# Patient Record
Sex: Female | Born: 1960 | Race: White | Hispanic: No | Marital: Married | Smoking: Never smoker
Health system: Southern US, Community
[De-identification: ages and names within clinical notes are randomized; demographics above are authoritative.]

## PROBLEM LIST (undated history)

## (undated) DIAGNOSIS — F419 Anxiety disorder, unspecified: Secondary | ICD-10-CM

## (undated) DIAGNOSIS — M1990 Unspecified osteoarthritis, unspecified site: Secondary | ICD-10-CM

## (undated) DIAGNOSIS — T7840XA Allergy, unspecified, initial encounter: Secondary | ICD-10-CM

## (undated) DIAGNOSIS — G709 Myoneural disorder, unspecified: Secondary | ICD-10-CM

## (undated) DIAGNOSIS — Z789 Other specified health status: Secondary | ICD-10-CM

## (undated) HISTORY — DX: Allergy, unspecified, initial encounter: T78.40XA

## (undated) HISTORY — PX: DILATION AND CURETTAGE OF UTERUS: SHX78

## (undated) HISTORY — PX: TONSILLECTOMY: SUR1361

## (undated) HISTORY — DX: Anxiety disorder, unspecified: F41.9

## (undated) HISTORY — PX: JOINT REPLACEMENT: SHX530

---

## 2002-04-20 ENCOUNTER — Other Ambulatory Visit: Admission: RE | Admit: 2002-04-20 | Discharge: 2002-04-20 | Payer: Self-pay | Admitting: Obstetrics and Gynecology

## 2003-05-18 ENCOUNTER — Other Ambulatory Visit: Admission: RE | Admit: 2003-05-18 | Discharge: 2003-05-18 | Payer: Self-pay | Admitting: Obstetrics and Gynecology

## 2004-06-14 ENCOUNTER — Other Ambulatory Visit: Admission: RE | Admit: 2004-06-14 | Discharge: 2004-06-14 | Payer: Self-pay | Admitting: Obstetrics and Gynecology

## 2005-07-10 ENCOUNTER — Other Ambulatory Visit: Admission: RE | Admit: 2005-07-10 | Discharge: 2005-07-10 | Payer: Self-pay | Admitting: Obstetrics and Gynecology

## 2009-01-25 ENCOUNTER — Encounter: Admission: RE | Admit: 2009-01-25 | Discharge: 2009-01-25 | Payer: Self-pay | Admitting: Sports Medicine

## 2009-01-25 ENCOUNTER — Ambulatory Visit: Payer: Self-pay

## 2009-01-25 DIAGNOSIS — M5412 Radiculopathy, cervical region: Secondary | ICD-10-CM | POA: Insufficient documentation

## 2009-02-08 ENCOUNTER — Ambulatory Visit: Payer: Self-pay | Admitting: Sports Medicine

## 2009-02-08 DIAGNOSIS — M7710 Lateral epicondylitis, unspecified elbow: Secondary | ICD-10-CM | POA: Insufficient documentation

## 2009-02-10 ENCOUNTER — Encounter: Admission: RE | Admit: 2009-02-10 | Discharge: 2009-02-10 | Payer: Self-pay | Admitting: Sports Medicine

## 2009-02-12 ENCOUNTER — Encounter: Payer: Self-pay | Admitting: Sports Medicine

## 2009-02-13 ENCOUNTER — Ambulatory Visit: Payer: Self-pay | Admitting: Sports Medicine

## 2009-02-13 ENCOUNTER — Telehealth (INDEPENDENT_AMBULATORY_CARE_PROVIDER_SITE_OTHER): Payer: Self-pay | Admitting: *Deleted

## 2009-03-01 ENCOUNTER — Telehealth: Payer: Self-pay | Admitting: Sports Medicine

## 2009-03-02 ENCOUNTER — Ambulatory Visit: Payer: Self-pay | Admitting: Sports Medicine

## 2009-03-02 DIAGNOSIS — L255 Unspecified contact dermatitis due to plants, except food: Secondary | ICD-10-CM | POA: Insufficient documentation

## 2009-03-13 ENCOUNTER — Ambulatory Visit: Payer: Self-pay | Admitting: Sports Medicine

## 2009-03-30 ENCOUNTER — Emergency Department (HOSPITAL_COMMUNITY): Admission: EM | Admit: 2009-03-30 | Discharge: 2009-03-30 | Payer: Self-pay | Admitting: Emergency Medicine

## 2009-04-16 ENCOUNTER — Ambulatory Visit: Payer: Self-pay | Admitting: Sports Medicine

## 2010-09-09 ENCOUNTER — Ambulatory Visit: Payer: Self-pay | Admitting: Family Medicine

## 2010-10-08 ENCOUNTER — Ambulatory Visit
Admission: RE | Admit: 2010-10-08 | Discharge: 2010-10-08 | Payer: Self-pay | Source: Home / Self Care | Attending: Sports Medicine | Admitting: Sports Medicine

## 2010-10-08 DIAGNOSIS — M25539 Pain in unspecified wrist: Secondary | ICD-10-CM | POA: Insufficient documentation

## 2010-10-08 DIAGNOSIS — G5600 Carpal tunnel syndrome, unspecified upper limb: Secondary | ICD-10-CM | POA: Insufficient documentation

## 2010-10-29 NOTE — Assessment & Plan Note (Signed)
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 this causes a broadbased disk bulge that causes some change of signal in spinal chord C6/7 also shows some disk bulging some change of signal but less so these is edema in vertebral body of C6 and more at C7  MRI shows some cervical spinal stenosis DDD mostly 2 levels some marrow edema in lower Cx spine   Impression & Recommendations:  Problem # 1:  CERVICAL RADICULOPATHY (ICD-723.4)  begin cervical collar as directed stop tennis until cleared see isntructions  reck 4 wks  Orders: Cervical Foam Collar Non Adjustable (L0120)  Problem # 2:  LATERAL EPICONDYLITIS, LEFT (ICD-726.32) see instructions  Complete Medication List: 1)  Gabapentin 600 Mg Tabs (Gabapentin) .... Take 1 tab three times a day 2)  Tramadol Hcl 50 Mg Tabs (Tramadol hcl) .Marland Kitchen.. 1 by mouth q 6 hrs as needed pain 3)  Diclofenac Sodium 75 Mg Tbec (Diclofenac sodium) .Marland Kitchen.. 1 by mouth two times a day with food  Patient Instructions: 1)  Elbow 2)  begin wrist curls 3)  wrist rolls 4)  do 3 x 30 with only 1 lb 5)  Tennis - hold off until we clear you 6)  neck collar at least 1 hour 3 to 4 times per day 7)  neck motion exercises - easy for 3 moinutes or so 3 to 4 times per day 8)  posture - don't let your head drift forward 9)  cheek bone over breast bone 10)  return in 4 wks

## 2010-10-29 NOTE — Assessment & Plan Note (Signed)
Summary: FU ELBOW PAIN/JW   Vital Signs:  Patient profile:   50 year old female BP sitting:   138 / 80  History of Present Illness: 50yo female to office for f/u L elbow pain.  Pt L-handed tennis player, dx with C7 radiculopathy last visit.  Started on Tramadol & Neurontin, but don't seem to help.  Tramadol makes her "fuzzy" & notices no change with Neurontin (currently on 300mg  q AM & 600mg  q PM & hs.  Pain worse of past 2-weeks & radiating down shoulder to hands.  Feeling more weak in arms & hands.  Difficulty holding utensils.  Played tennis yesterday & really exacerbated pain.  Not using wrist strap.  Some numbness/tingling in hand - mainly 3rd/4th fingers.  No prior injections.  No injury/trauma since last visit.  X-rays following last visit show significant DJD in area of C5-C6.  Physical Exam  General:  Well-developed,well-nourished,in no acute distress; alert,appropriate and cooperative throughout examination Msk:  Neck:  Full ROM today without reproduction of symptoms. Spurling's on L with shooting pain into L trap. R spurling's test negative.  Msk:  no muscle atrophy in either upper extremity. She has weakness on L with triceps testing, wrist extension, finger flexors, pinch between thumb & 1st finger.  Normal biceps strength.  Normal wrist flexion strength. Normal interossei strength.    L elbow with full and painless ROM.  TTP over lateral epicondyle.  Increased pain in lateral epicondyle with resisted wrist extension. Negative tinel's at cubital tunnel and at flexor retinatulum of wrist.     Skin:  no lesions or rashes Pulses:  Pulses:  2+ radial and ulnar pulses, bilaterally symmetric Neurologic:  Neurologic:  +2/4 brachioradialis, biceps, triceps b/l  intact sensation throughout Additional Exam:  MSK u/s: L elbow with small amount of fluid noted around extensor origin & small amount of calcific change.  Extensor muscles all intact. Note findings are consistent on long  view but much more specific on trans view for lateral epicondylitis However, there is no evidence of tear or of major calckfic change images saved   Impression & Recommendations:  Problem # 1:  CERVICAL RADICULOPATHY (ICD-723.4) - Will order MRI to further evaluate since symptoms worsening & more pronounced weakness.  Rx for Valium 5mg  three times a day as needed - should take 2 tabs prior to MRI to help with anxiety/clostriphobia. - Rx for Diclofenac 75mg  two times a day with food - Increase Tramadol to 50mg  2 tabs qid for pain - Increase Neurontin to goal of 600mg  three times a day - May consider soft neck collar to unload pressure on neck, but will await MRI results - No tennis at this time  Orders: MRI without Contrast (MRI w/o Contrast)  Problem # 2:  LATERAL EPICONDYLITIS, LEFT (ICD-726.32)  - Likely exacerbated/related to cervical radiculopathy - u/s of elbow performed showing minor fluid collection at extensor origin - May attempt some light arm exercises with 1-3lbs of resistance, but should decrease activity if experiencing pain - No tennis at this time  Orders: US EXTREMITY NON-VASC REAL-TIME IMG ([address])  Complete Medication List: 1)  Gabapentin 600 Mg Tabs (Gabapentin) .... Take 1 tab three times a day 2)  Tramadol Hcl 50 Mg Tabs (Tramadol hcl) .Marland Kitchen.. 1 by mouth q 6 hrs as needed pain 3)  Diclofenac Sodium 75 Mg Tbec (Diclofenac sodium) .Marland Kitchen.. 1 by mouth two times a day with food  Patient Instructions: 1)  MRI APPT IS SAT, MAY 15TH AT 7:15  AM AT THE [facility]. PT INFORMED 2)  Start Diclofenac two times a day with food 3)  Increase neurontin to 600mg  three times a day 4)  No tennis until further follow-up 5)  Follow-up in 2-3 weeks Prescriptions: DICLOFENAC SODIUM 75 MG TBEC (DICLOFENAC SODIUM) 1 by mouth two times a day with food  #60 x 3   Entered and Authorized by:   Enid Baas MD   Signed by:   Enid Baas MD on 02/08/2009   Method used:    Electronically to        [facility]       [address]       [place], [place]  [address]       Ph: [phone]       Fax: [phone]   RxID:   [id number]

## 2010-10-29 NOTE — Progress Notes (Signed)
Summary: PREAUTH-AIM  PREAUTH-AIM   Imported ByLevada Schilling 02/08/2009 16:23:47  _____________________________________________________________________  External Attachment:    Type:   Image     Comment:   External Document

## 2010-10-29 NOTE — Progress Notes (Signed)
Summary: needs return call  Phone Note Call from Patient Call back at [phone]   Caller: Patient Reason for Call: Talk to Nurse, Talk to Doctor Summary of Call: Requesting return call.  Has a bad case of poison ivy and needs to get a shot.  Wants to know if shot will interfere with any current meds. Initial call taken by: Levada Schilling,  March 01, 2009 4:03 PM    She will come by in morning for a cortisone injection for poison ivy.

## 2010-10-29 NOTE — Assessment & Plan Note (Signed)
Summary: cortisone shot for poison ivy/jw    Complete Medication List: 1)  Gabapentin 600 Mg Tabs (Gabapentin) .... Take 1 tab three times a day 2)  Tramadol Hcl 50 Mg Tabs (Tramadol hcl) .Marland Kitchen.. 1 by mouth q 6 hrs as needed pain 3)  Diclofenac Sodium 75 Mg Tbec (Diclofenac sodium) .Marland Kitchen.. 1 by mouth two times a day with food 4)  Triamcinolone Acetonide 0.1 % Oint (Triamcinolone acetonide) .... Use three times a day as needed poison ivy  Other Orders: Kenalog 10 mg inj (Y7829) Prescriptions: TRIAMCINOLONE ACETONIDE 0.1 % OINT (TRIAMCINOLONE ACETONIDE) use three times a day as needed poison ivy  #60 x 5   Entered by:   Lillia Pauls CMA   Authorized by:   Enid Baas MD   Signed by:   Lillia Pauls CMA on 03/02/2009   Method used:   Electronically to        [facility]       [address]       [place], [place]  [address]       Ph: [phone]       Fax: [phone]   RxID:   [id number]    Medication Administration  Injection # 1:    Medication: Kenalog 10 mg inj    Diagnosis: CONTACT DERMATITIS DUE TO POISON IVY (ICD-692.6)    Route: IM    Site: R deltoid    Exp Date: 07/30/2010    Lot #: [id number]    Mfr: [employer]    Comments: 40 mg given    Patient tolerated injection without complications    Given by: Lillia Pauls CMA (March 02, 2009 10:15 AM)  Orders Added: 1)  Kenalog 10 mg inj [J3301] 2)  Est. Patient Level I [38756]

## 2010-10-29 NOTE — Progress Notes (Signed)
Summary: returning call  Phone Note Call from Patient Call back at [phone] OR [phone]   Caller: Patient Reason for Call: Talk to Nurse Summary of Call: Returning call to get MRI results. Initial call taken by: Levada Schilling,  Feb 13, 2009 8:44 AM  Follow-up for Phone Call        pt coming in this pm to discuss MRI results Follow-up by: Lillia Pauls CMA,  Feb 13, 2009 11:23 AM

## 2010-10-29 NOTE — Assessment & Plan Note (Signed)
Summary: FU NECK PROBLEMS   Vital Signs:  Patient profile:   50 year old female Pulse rate:   72 / minute BP sitting:   129 / 81  (right arm)  Vitals Entered By: Terese Door (March 13, 2009 8:57 AM) CC: F/u neck problems   CC:  F/u neck problems.  History of Present Illness: uses cervical collar infrequently but does help pain down into arm has pretty much stopped but not playing tennis numbness is less over last week not numb wth driving now and does not wake up at night with this  tennis elbow motion still hurts this sometinme no pain as not playing tennis  poison ivy resolved with injection   Physical Exam  General:  Well-developed,well-nourished,in no acute distress; alert,appropriate and cooperative throughout examination Msk:  testing of C5 to T1 reveals minimal weakness of C5/6 good neck ROM without limitations otherwise good strength no sensory change today  left elbow still slightly tender but not as severe minimal pain on ext of 3rd finger some pain on ext of wrist  good posture   Impression & Recommendations:  Problem # 1:  CERVICAL RADICULOPATHY (ICD-723.4) definitely improved with medications and rest and some use of collar cont same program x 3 weeks in week 4 try hitting tennis drills up to 30 mins per day and see if this aggravates sxs reck 4 wks  Problem # 2:  LATERAL EPICONDYLITIS, LEFT (ICD-726.32) better with sxs start wrist extension and rotation exercises emphasize eccentrics  Complete Medication List: 1)  Gabapentin 600 Mg Tabs (Gabapentin) .... Take 1 tab three times a day 2)  Tramadol Hcl 50 Mg Tabs (Tramadol hcl) .Marland Kitchen.. 1 by mouth q 6 hrs as needed pain 3)  Diclofenac Sodium 75 Mg Tbec (Diclofenac sodium) .Marland Kitchen.. 1 by mouth two times a day with food 4)  Triamcinolone Acetonide 0.1 % Oint (Triamcinolone acetonide) .... Use three times a day as needed poison ivy

## 2010-10-29 NOTE — Assessment & Plan Note (Signed)
Summary: fu neck/jw   Vital Signs:  Patient profile:   50 year old female Pulse rate:   79 / minute BP sitting:   136 / 85  (right arm)  Vitals Entered By: Terese Door (April 16, 2009 11:23 AM) CC: f/u neck   CC:  f/u neck.  History of Present Illness: July 1 fell down steps/ stiches has been out of action for awhile after  tennis elbow tried hitting tennis simople drills no real pain with strokes pain level very low now  cervical radiculopathy no tingling now for 3 weeks on gabapentin 600 three times a day also using voltaren 75 two times a day  generally much better     Physical Exam  General:  Well-developed,well-nourished,in no acute distress; alert,appropriate and cooperative throughout examination Neck:  good ROM in all directions except slightly tight on left lateral bend Msk:  normal testing of strength, sensation and movement C5 to T1  Left lateral elbow non tender good wrist and finger ext strength norm wrist ROM   Impression & Recommendations:  Problem # 1:  CERVICAL RADICULOPATHY (ICD-723.4) much better  will progressively wean her neurontin stop collar  rtc prn  Problem # 2:  LATERAL EPICONDYLITIS, LEFT (ICD-726.32) this is better keep up exercises on days not playing tennis gradually increase tennis   Complete Medication List: 1)  Gabapentin 600 Mg Tabs (Gabapentin) .... Take 1 tab three times a day 2)  Tramadol Hcl 50 Mg Tabs (Tramadol hcl) .Marland Kitchen.. 1 by mouth q 6 hrs as needed pain 3)  Diclofenac Sodium 75 Mg Tbec (Diclofenac sodium) .Marland Kitchen.. 1 by mouth two times a day with food 4)  Triamcinolone Acetonide 0.1 % Oint (Triamcinolone acetonide) .... Use three times a day as needed poison ivy  Patient Instructions: 1)  diclofenac - pain or inflammation; ok to use 2 a day when needed 2)  gabapentin - let's start weaning 3)  first week - 600 twice a day 4)  week 2 - 1/2 tab in morning and whole tab in evening 5)  week 3 - 1/2 tab twice a  day 6)  week 4 to 6 1/2 tab at night only 7)  wk 7 - try every other night 8)  If OK then stop 9)  If at any level you get more tingling and numbness coming back - go back up one level and stay for an additional week before trying again to wean 10)  Elbow keep up exercises on days you are not playing 11)  go ahead and restart tennis gradually Prescriptions: DICLOFENAC SODIUM 75 MG TBEC (DICLOFENAC SODIUM) 1 by mouth two times a day with food  #60 x 6   Entered and Authorized by:   Enid Baas MD   Signed by:   Enid Baas MD on 04/16/2009   Method used:   Electronically to        [facility]       [address]       [place], [place]  [address]       Ph: [phone]       Fax: [phone]   RxID:   [id number] GABAPENTIN 600 MG TABS (GABAPENTIN) take 1 tab three times a day  #90 Tablet x 3   Entered and Authorized by:   Enid Baas MD   Signed by:   Enid Baas MD on 04/16/2009   Method used:   [facility]ron[facility]  [facility]       [address]       [place], [place]  [address]       Ph: [phone]       Fax: [phone]   RxID:   [id number]

## 2010-10-29 NOTE — Progress Notes (Signed)
Summary: Paige Woodard   Imported ByLevada Schilling 02/12/2009 11:11:16  _____________________________________________________________________  External Attachment:    Type:   Image     Comment:   External Document

## 2010-10-31 NOTE — Assessment & Plan Note (Signed)
Summary: PAIN AND NUMBNESS ELBOWS FINGERS   Vital Signs:  Patient profile:   50 year old female BP sitting:   137 / 91  Vitals Entered By: Lillia Pauls CMA (October 08, 2010 11:06 AM)  History of Present Illness: neck- pain in neck is mostly gone.  gabapentin and tramadol she has weaned herself off of (medication made her feel hyper), used soft neck collar mostly at night.  pain that was shooting into arm is now gone.  thinks her shoulder range of motion is reducing.  hand numbness   takes benedryl at night for sleep.  when she wakes up she cant move her hands because they are numb and stiff.  also thinks they are swollen.  no elbow pain, but has an ache from elbows down.  feels that she has trouble getting going in the am, washing hair.  takes 15 min to feel better.    Hx of significant carpal tunnel in pregnancy now having perimenopausal sxs  ddenies night sweats, fevers, weight loss.   Current Medications (verified): 1)  Gabapentin 300 Mg Caps (Gabapentin) .... Patient Has Written Taper Taper Up To 2 By Mouth Three Times A Day 2)  Tramadol Hcl 50 Mg Tabs (Tramadol Hcl) .Marland Kitchen.. 1-2 By Mouth Two Times A Day / Three Times A Day As Needed Neck Pain  Allergies (verified): 1)  ! * Cdn  Review of Systems  The patient denies fever, chest pain, and syncope.    Physical Exam  General:  Well-developed,well-nourished,in no acute distress; alert,appropriate and cooperative throughout examination Msk:  Neck: Grip strength and sensation normal in bilateral hands Strength good C4 to T1 distribution Wrist: positive phalen test, negative tinel, no thenar, hypotenar atrophy seen augmented phalen is very + RT w pain and causes numbness on left  hand: no sinovial thickening, no joint effusions, no nodules, no deviation   Additional Exam:  MSK [place] Median nerve is visaulized bilat On RT the circumference is 0.17cm2 vs norm of 0.11 or less there is noted narrowing with pre and  post  widening of nerve on long view  These findings are essentially symmetrical with circ on LT being 0.17cm2 as well   Impression & Recommendations:  Problem # 1:  WRIST PAIN, BILATERAL (ICD-719.43) Assessment Deteriorated  Pt with symptoms consistent with carpal tunnel B, u/s exam also consistent with enlarged median nerve.  Pt is perimenapausal which may be a contributing factor.  injection of right side today.  given splints to wear for 1 month at night.  to take 1 tab gabapentin at bedtime.  ice for pain and warm wter baths daily. RTC in 6 weeks.   Orders: Brace Wrist [phone]) [place] LIMITED ([id number]) Joint Aspirate / Injection, Small (09811) Kenalog 10 mg inj (J3301)  Problem # 2:  CARPAL TUNNEL SYNDROME, BILATERAL (ICD-354.0)  she meets [place] diagnostic criteria for carpal tunnel bilat   cleanse with alcohol Topical analgesic spray : Ethyl choride Joint RT wrist Approached in typical fashion with:injection at dist wrist crease and placed under mid point of palmar ligament angled toward space between 3rd and 4th fingers Completed without difficulty Meds:kenalog 10+ 1cc lidocaine 1% Needle: 25 G and 1.5 inch Aftercare instructions and Red flags advised  will try injection to see if this helps her sxs on worst side If this is quite helpful we can move to inject other side sooner  Orders: [place] LIMITED (91478) Joint Aspirate / Injection, Small (29562) Kenalog 10 mg inj (Z3086)  Problem # 3:  CERVICAL RADICULOPATHY (ICD-723.4) this possibly could be cause of her bilat wrist pain but exam today does not suggest this  definitely neck sxs seem better since she last saw Dr Jennette Kettle  Complete Medication List: 1)  Gabapentin 300 Mg Caps (Gabapentin) .... Patient has written taper taper up to 2 by mouth three times a day 2)  Tramadol Hcl 50 Mg Tabs (Tramadol hcl) .Marland Kitchen.. 1-2 by mouth two times a day / three times a day as needed neck pain   Orders Added: 1)  Brace Wrist [L3908] 2)  Est. Patient  Level III [[address]] 3)  [place] LIMITED [76882] 4)  Joint Aspirate / Injection, Small [20600] 5)  Kenalog 10 mg inj [J3301]

## 2010-10-31 NOTE — Assessment & Plan Note (Signed)
Summary: NECK PAIN,MC   Vital Signs:  Patient profile:   50 year old female Height:      62 inches Weight:      140 pounds BMI:     25.70 BP sitting:   132 / 88  Vitals Entered By: Lillia Pauls CMA (September 09, 2010 2:46 PM)   History of Present Illness: Return of neck pain with radiation into left upper arm--worse with certain movements but also occurring at rest some of the time. had gotten better alst year, quit wearing the neck brace and stopped the neurontin---did p-retty well for a while until last few weeks. No new injury. Pain is in deltoid area, sharp and burning. Does not usually radiate past upper arm. Occasional zinging into her hand but not ofter.  Worse with using her arms overhead--says she tries to keep her elbows in when she tries to lift anything otherwise it increases her neck pain.   ROS: Denies headache, no vision or balance issues.  PERTINENT PMH/PSH: no prior neck surgery--original time of symptom onset 02/2009 while playing tennis. 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 Patient Has Written Taper Taper Up To 2 By Mouth Three Times A Day 2)  Tramadol Hcl 50 Mg Tabs (Tramadol Hcl) .Marland Kitchen.. 1-2 By Mouth Two Times A Day / Three Times A Day As Needed Neck Pain  Allergies (verified): 1)  ! * Cdn  Past History:  Risk Factors: Smoking Status: never (09/09/2010)  Social History: Smoking Status:  never  Physical Exam  General:  alert, well-developed, well-nourished, and well-hydrated.   Neck:  forward flexion full but mmildly painful in posterior neck pain, no radiation. Spurlings not done. Normal full extension and lateral rotation  UE strengthis 4-5/5 left deltoid and 5/5 right, otherwise 5/5 symmetrical in all planes. Some pain with abduction laterally which may  account for the difference seen in strenth. Also pain with E onthe left.  normal sensation in her hans. normal grip strength, normal wrist flexion and extension AROM.  DTRs 2= elbows Additional Exam:  Review of her MRi images with her.    Impression & Recommendations:  Problem # 1:  CERVICAL RADICULOPATHY (ICD-723.4) recently worse. I do not think we need to re-image atthis time. see patient instructions.rtc 3-4 weeks. we discussed red flags.  Notable she also had pain with some RC testing--there may be a concurrent RC subacromial bursitis. We will re-eval at  return ov.  Complete Medication List: 1)  Gabapentin 300 Mg Caps (Gabapentin) .... Patient has written taper taper up to 2 by mouth three times a day 2)  Tramadol Hcl 50 Mg Tabs (Tramadol hcl) .Marland Kitchen.. 1-2 by mouth two times a day / three times a day as needed neck pain  Patient Instructions: 1)  Soft neck collar as much as you can intially. 2)  Start tramadol for pain--1-2 tabs two times a day or 1 tab three times a day (I would stick with max 4 tabs a day if possible) 3)  Gabapentin: 300 mg tabs. 4)  1) Start 1 by mouth at night for three days,  5)  2) then increase to one by mouth two times a day for three days.  6)   3) Then three times a day for three days.  7)  4) hen  1 am, one lunch and 2 at bedtime 8)  5) Then 2 am, 1 lunch and 2 at bedtime 9)  6) then 2 by mouth three times a day and follow up. 10)  Watch for dizziness, sleepiness, unsteadt gait.  11)  Call with problems 12)  Great to meet you! Prescriptions: TRAMADOL HCL 50 MG TABS (TRAMADOL HCL) 1-2 by mouth two times a day / three times a day as needed neck pain  #150 x 1   Entered and Authorized by:   Denny Levy MD   Signed by:   Denny Levy MD on 09/09/2010   Method used:   Electronically to        [facility]       [address]       [place], [place]  [address]       Ph: [phone]       Fax: [phone]   RxID:    [id number] GABAPENTIN 300 MG CAPS (GABAPENTIN) patient has written taper taper up to 2 by mouth three times a day  #180 x 2   Entered and Authorized by:   Denny Levy MD   Signed by:   Denny Levy MD on 09/09/2010   Method used:   Electronically to        [facility]       [address]       [place], [place]  [address]       Ph: [phone]       Fax: [phone]   RxID:   [id number]    Orders Added: 1)  Est. Patient Level IV [06301]

## 2010-11-11 ENCOUNTER — Encounter: Payer: Self-pay | Admitting: Sports Medicine

## 2010-11-11 ENCOUNTER — Ambulatory Visit (INDEPENDENT_AMBULATORY_CARE_PROVIDER_SITE_OTHER): Payer: BC Managed Care – PPO | Admitting: Sports Medicine

## 2010-11-11 DIAGNOSIS — G5600 Carpal tunnel syndrome, unspecified upper limb: Secondary | ICD-10-CM

## 2010-11-20 NOTE — Assessment & Plan Note (Signed)
Summary: FU WRIST PAIN/MJD   Vital Signs:  Patient profile:   50 year old female BP sitting:   129 / 81  Vitals Entered By: Lillia Pauls CMA (November 11, 2010 10:37 AM) CC: f/u wrist pain   CC:  f/u wrist pain.  History of Present Illness: Pt presents to clinic for f/u of wrist pain.  Rt wrist and hand improved after injection at last visit.   Lt wrist and hand continues to be painful and has numbness especially when she wakes up during the night.  Taking gabapentin twice daily which she feels is helpful.   Wearing night splints.   had this w pregnancy years ago now has perimenopausal sxs and carpal tunnel sxs bilat again  carpal tunnel confirmed on scan with median nerve 0.17cm2 bilat   sees Dr Vincente Poli next week  Allergies: 1)  ! * Cdn  Physical Exam  General:  Well-developed,well-nourished,in no acute distress; alert,appropriate and cooperative throughout examination Msk:  Positive phalen and tinel on rt Very postive with slight pressure and phalen and tinel on lt  Measurements with ergometer Rt hand- 4th finger - 19, 3rd finger - 21, 2nd finger - 24 Lt hand- 4th finger - 23, 3rd finger- 25, 2nd finger 27   Impression & Recommendations:  Problem # 1:  WRIST PAIN, BILATERAL (ICD-719.43) This improved w use of night splints and w injection on RT  wants to try this on left now since this now hurts worse  when pain is bad hand feels weak and can hurt just w using computer or any activity  Problem # 2:  CARPAL TUNNEL SYNDROME, BILATERAL (ICD-354.0)  on left we will inject   cleanse with alcohol Topical analgesic spray : Ethyl choride Joint - left wrist at carpal tunnel Approached in typical fashion with: injection under proximal crease and angled at tip of 4th finger medially Completed without difficulty Meds:10 mg kenalog and 2 cc !% lidocaine Needle: 30 G 5/8 in Aftercare instructions and Red flags advised  limit activity next 3 days use warm soaks and  squeeze rubber ball afterwards cont splints  increase gabapentin to tid  Orders: Joint Aspirate / Injection, Small (16109) Kenalog 10 mg inj (J3301)  Complete Medication List: 1)  Gabapentin 300 Mg Caps (Gabapentin) .Marland Kitchen.. 1 by mouth three times a day 2)  Tramadol Hcl 50 Mg Tabs (Tramadol hcl) .Marland Kitchen.. 1-2 by mouth two times a day / three times a day as needed neck pain  Patient Instructions: 1)  Ask your gynecologist about hormone levels in relation to carpal tunnel 2)  Will test finger strength at next visit in 6 weeks. Prescriptions: GABAPENTIN 300 MG CAPS (GABAPENTIN) 1 by mouth three times a day  #90 x 2   Entered by:   Rochele Pages RN   Authorized by:   Enid Baas MD   Signed by:   Rochele Pages RN on 11/11/2010   Method used:   Electronically to        [facility]       [address]       [place], [place]  [address]       Ph: [phone]       Fax: [phone]   RxID:   [id number]    Orders Added: 1)  Est. Patient Level III [44010] 2)  Joint Aspirate / Injection, Small [20600] 3)  Kenalog 10 mg inj [J3301]

## 2010-12-23 ENCOUNTER — Ambulatory Visit: Payer: BC Managed Care – PPO | Admitting: Sports Medicine

## 2010-12-30 ENCOUNTER — Ambulatory Visit (INDEPENDENT_AMBULATORY_CARE_PROVIDER_SITE_OTHER): Payer: BC Managed Care – PPO | Admitting: Family Medicine

## 2010-12-30 ENCOUNTER — Encounter: Payer: Self-pay | Admitting: Family Medicine

## 2010-12-30 VITALS — BP 139/89

## 2010-12-30 DIAGNOSIS — M79609 Pain in unspecified limb: Secondary | ICD-10-CM

## 2010-12-30 DIAGNOSIS — M79672 Pain in left foot: Secondary | ICD-10-CM

## 2010-12-30 NOTE — Progress Notes (Signed)
  Subjective:    Patient ID: Paige Woodard, female    DOB: [birth year], 50 y.o.   MRN: [id number]  HPI  Left foot pain or one to 2 weeks. Noticed it after she played tennis. Pain is there during tennis and afterward. Kind of dull aching pain. Mostly in her medial forefoot. Has noted no specific injury. That's no numbness or tingling.  Review of Systems No fever no weight loss    Objective:   Physical Exam    bilateral feet are in neurovascularly intact. There are no ecchymoses or skin lesions noted. She has a mildly flattened longitudinal arch bilaterally and bilaterally she has loss of the transverse arch that is prominent. Squeeze test is negative. The only area of mild tenderness to palpation is on the left foot dorsum between first and second and second rays.  Ultrasound of the Left Foot Reveals No Evidence of Stress Fracture in the Metatarsal Rays. There Is Very Small Amount of Fluid in the First MTP Joint. There Is No Evidence of Morton's Neuroma.    Assessment & Plan:  #1 foot pain: I think this is related to loss of transverse arch. I have given her an extra small metatarsal pad to place in her tennis shoes. We will try this for one to 2 weeks. If she is having relief of symptoms I would recommend she wear this for at least the next 4-6 weeks. If she is not having relief in one to 2 weeks she will return to clinic.

## 2011-02-28 ENCOUNTER — Other Ambulatory Visit: Payer: Self-pay | Admitting: *Deleted

## 2011-02-28 MED ORDER — GABAPENTIN 300 MG PO CAPS: ORAL_CAPSULE | ORAL | Status: DC

## 2011-06-19 ENCOUNTER — Ambulatory Visit: Payer: BC Managed Care – PPO | Admitting: Family Medicine

## 2011-10-30 ENCOUNTER — Encounter (HOSPITAL_BASED_OUTPATIENT_CLINIC_OR_DEPARTMENT_OTHER): Payer: Self-pay | Admitting: *Deleted

## 2011-10-30 NOTE — Progress Notes (Signed)
Pt very allergic to anything codeine family-can take dilaudid No labs needed

## 2011-11-04 ENCOUNTER — Other Ambulatory Visit: Payer: Self-pay | Admitting: Orthopedic Surgery

## 2011-11-05 ENCOUNTER — Ambulatory Visit (HOSPITAL_BASED_OUTPATIENT_CLINIC_OR_DEPARTMENT_OTHER): Payer: BC Managed Care – PPO | Admitting: *Deleted

## 2011-11-05 ENCOUNTER — Encounter (HOSPITAL_BASED_OUTPATIENT_CLINIC_OR_DEPARTMENT_OTHER): Payer: Self-pay | Admitting: *Deleted

## 2011-11-05 ENCOUNTER — Ambulatory Visit (HOSPITAL_BASED_OUTPATIENT_CLINIC_OR_DEPARTMENT_OTHER)
Admission: RE | Admit: 2011-11-05 | Discharge: 2011-11-05 | Disposition: A | Discharge status: Home / Self Care | Payer: BC Managed Care – PPO | Source: Ambulatory Visit | Attending: Orthopedic Surgery | Admitting: Orthopedic Surgery

## 2011-11-05 ENCOUNTER — Encounter (HOSPITAL_BASED_OUTPATIENT_CLINIC_OR_DEPARTMENT_OTHER)
Admission: RE | Disposition: A | Discharge status: Home / Self Care | Payer: Self-pay | Source: Ambulatory Visit | Attending: Orthopedic Surgery

## 2011-11-05 DIAGNOSIS — G5600 Carpal tunnel syndrome, unspecified upper limb: Secondary | ICD-10-CM | POA: Insufficient documentation

## 2011-11-05 HISTORY — DX: Other specified health status: Z78.9

## 2011-11-05 HISTORY — DX: Myoneural disorder, unspecified: G70.9

## 2011-11-05 HISTORY — PX: CARPAL TUNNEL RELEASE: SHX101

## 2011-11-05 LAB — POCT HEMOGLOBIN-HEMACUE: Hemoglobin: 13.2 g/dL (ref 12.0–15.0)

## 2011-11-05 SURGERY — CARPAL TUNNEL RELEASE
Anesthesia: Regional | Site: Wrist | Laterality: Right | Wound class: Clean

## 2011-11-05 MED ORDER — POVIDONE-IODINE 7.5 % EX SOLN: Freq: Once | CUTANEOUS | Status: DC

## 2011-11-05 MED ORDER — FENTANYL CITRATE 0.05 MG/ML IJ SOLN
INTRAMUSCULAR | Status: DC | PRN
  Administered 2011-11-05: 100 ug via INTRAVENOUS

## 2011-11-05 MED ORDER — PROPOFOL 10 MG/ML IV EMUL
INTRAVENOUS | Status: DC | PRN
  Administered 2011-11-05: 100 ug/kg/min via INTRAVENOUS

## 2011-11-05 MED ORDER — MEPERIDINE HCL 25 MG/ML IJ SOLN: 6.2500 mg | INTRAMUSCULAR | Status: DC | PRN

## 2011-11-05 MED ORDER — LACTATED RINGERS IV SOLN
INTRAVENOUS | Status: DC | PRN
  Administered 2011-11-05: 11:00:00 via INTRAVENOUS

## 2011-11-05 MED ORDER — LACTATED RINGERS IV SOLN
INTRAVENOUS | Status: DC
  Administered 2011-11-05: 11:00:00 via INTRAVENOUS

## 2011-11-05 MED ORDER — PROMETHAZINE HCL 25 MG/ML IJ SOLN: 6.2500 mg | INTRAMUSCULAR | Status: DC | PRN

## 2011-11-05 MED ORDER — MIDAZOLAM HCL 5 MG/5ML IJ SOLN
INTRAMUSCULAR | Status: DC | PRN
  Administered 2011-11-05: 2 mg via INTRAVENOUS

## 2011-11-05 MED ORDER — BUPIVACAINE HCL (PF) 0.5 % IJ SOLN
INTRAMUSCULAR | Status: DC | PRN
  Administered 2011-11-05: 5 mL

## 2011-11-05 MED ORDER — ONDANSETRON HCL 4 MG/2ML IJ SOLN
INTRAMUSCULAR | Status: DC | PRN
  Administered 2011-11-05: 4 mg via INTRAVENOUS

## 2011-11-05 MED ORDER — FENTANYL CITRATE 0.05 MG/ML IJ SOLN: 25.0000 ug | INTRAMUSCULAR | Status: DC | PRN

## 2011-11-05 MED ORDER — HYDROMORPHONE HCL 2 MG PO TABS: 2.0000 mg | ORAL_TABLET | Freq: Four times a day (QID) | ORAL | Status: AC | PRN

## 2011-11-05 MED ORDER — CEFAZOLIN SODIUM 1-5 GM-% IV SOLN: 1.0000 g | INTRAVENOUS | Status: DC

## 2011-11-05 SURGICAL SUPPLY — 47 items
BANDAGE ELASTIC 3 VELCRO ST LF (GAUZE/BANDAGES/DRESSINGS) ×2 IMPLANT
BLADE SURG 15 STRL LF DISP TIS (BLADE) ×1 IMPLANT
BLADE SURG 15 STRL SS (BLADE) ×2
BNDG CMPR 9X4 STRL LF SNTH (GAUZE/BANDAGES/DRESSINGS)
BNDG ESMARK 4X9 LF (GAUZE/BANDAGES/DRESSINGS) IMPLANT
CLOTH BEACON ORANGE TIMEOUT ST (SAFETY) ×2 IMPLANT
COVER MAYO STAND STRL (DRAPES) ×2 IMPLANT
COVER TABLE BACK 60X90 (DRAPES) ×2 IMPLANT
CUFF TOURNIQUET SINGLE 18IN (TOURNIQUET CUFF) IMPLANT
DECANTER SPIKE VIAL GLASS SM (MISCELLANEOUS) IMPLANT
DRAPE EXTREMITY T 121X128X90 (DRAPE) ×2 IMPLANT
DRAPE SURG 17X23 STRL (DRAPES) ×2 IMPLANT
DRSG EMULSION OIL 3X3 NADH (GAUZE/BANDAGES/DRESSINGS) ×1 IMPLANT
DURAPREP 26ML APPLICATOR (WOUND CARE) ×2 IMPLANT
ELECT NDL TIP 2.8 STRL (NEEDLE) IMPLANT
ELECT NEEDLE TIP 2.8 STRL (NEEDLE) IMPLANT
ELECT REM PT RETURN 9FT ADLT (ELECTROSURGICAL)
ELECTRODE REM PT RTRN 9FT ADLT (ELECTROSURGICAL) IMPLANT
GLOVE BIO SURGEON STRL SZ 6.5 (GLOVE) ×1 IMPLANT
GLOVE BIOGEL PI IND STRL 8 (GLOVE) ×2 IMPLANT
GLOVE BIOGEL PI INDICATOR 8 (GLOVE) ×2
GLOVE ECLIPSE 7.5 STRL STRAW (GLOVE) ×4 IMPLANT
GLOVE INDICATOR 7.0 STRL GRN (GLOVE) ×1 IMPLANT
GOWN BRE IMP PREV XXLGXLNG (GOWN DISPOSABLE) ×2 IMPLANT
GOWN PREVENTION PLUS XLARGE (GOWN DISPOSABLE) ×2 IMPLANT
GOWN PREVENTION PLUS XXLARGE (GOWN DISPOSABLE) ×2 IMPLANT
NDL HYPO 25X1 1.5 SAFETY (NEEDLE) IMPLANT
NEEDLE HYPO 25X1 1.5 SAFETY (NEEDLE) ×2 IMPLANT
NS IRRIG 1000ML POUR BTL (IV SOLUTION) ×1 IMPLANT
PACK BASIN DAY SURGERY FS (CUSTOM PROCEDURE TRAY) ×2 IMPLANT
PAD CAST 3X4 CTTN HI CHSV (CAST SUPPLIES) ×1 IMPLANT
PADDING CAST ABS 4INX4YD NS (CAST SUPPLIES)
PADDING CAST ABS COTTON 4X4 ST (CAST SUPPLIES) IMPLANT
PADDING CAST COTTON 3X4 STRL (CAST SUPPLIES) ×2
PADDING WEBRIL 3 STERILE (GAUZE/BANDAGES/DRESSINGS) ×1 IMPLANT
PENCIL BUTTON HOLSTER BLD 10FT (ELECTRODE) IMPLANT
SPLINT PLASTER CAST XFAST 3X15 (CAST SUPPLIES) ×5 IMPLANT
SPLINT PLASTER XTRA FASTSET 3X (CAST SUPPLIES) ×5
SPONGE GAUZE 4X4 12PLY (GAUZE/BANDAGES/DRESSINGS) ×2 IMPLANT
STOCKINETTE 4X48 STRL (DRAPES) ×2 IMPLANT
SUT ETHILON 4 0 PS 2 18 (SUTURE) ×2 IMPLANT
SYR BULB 3OZ (MISCELLANEOUS) ×2 IMPLANT
SYR CONTROL 10ML LL (SYRINGE) ×1 IMPLANT
TOWEL OR 17X24 6PK STRL BLUE (TOWEL DISPOSABLE) ×2 IMPLANT
TOWEL OR NON WOVEN STRL DISP B (DISPOSABLE) ×2 IMPLANT
UNDERPAD 30X30 INCONTINENT (UNDERPADS AND DIAPERS) ×2 IMPLANT
WATER STERILE IRR 1000ML POUR (IV SOLUTION) ×1 IMPLANT

## 2011-11-05 NOTE — Anesthesia Postprocedure Evaluation (Signed)
  Anesthesia Post-op Note  Patient: Paige Woodard  Procedure(s) Performed:  CARPAL TUNNEL RELEASE  Patient Location: PACU  Anesthesia Type: MAC and Bier block  Level of Consciousness: awake, alert  and oriented  Airway and Oxygen Therapy: Patient Spontanous Breathing  Post-op Pain: none  Post-op Assessment: Post-op Vital signs reviewed, Patient's Cardiovascular Status Stable, Respiratory Function Stable, Patent Airway and No signs of Nausea or vomiting  Post-op Vital Signs: Reviewed and stable  Complications: No apparent anesthesia complications

## 2011-11-05 NOTE — H&P (Signed)
PREOPERATIVE H&P  Chief Complaint: r. Hand pain  HPI: Paige Woodard is a 51 y.o. female who presents for evaluation of r hand numbness and tingling . It has been present for many years and has been worsening. She has failed conservative measures. Pain is rated as moderate.  Past Medical History  Diagnosis Date  . No pertinent past medical history   . Neuromuscular disorder     carpal tunnel   Past Surgical History  Procedure Date  . Dilation and curettage of uterus   . Tonsillectomy    History   Social History  . Marital Status: Married    Spouse Name: N/A    Number of Children: N/A  . Years of Education: N/A   Social History Main Topics  . Smoking status: Never Smoker   . Smokeless tobacco: None  . Alcohol Use: Yes     occ  . Drug Use: No  . Sexually Active: None   Other Topics Concern  . None   Social History Narrative  . None   History reviewed. No pertinent family history. Allergies  Allergen Reactions  . Codeine Anaphylaxis  . Hydrocodone Itching  . Oxycodone Itching   Prior to Admission medications   Medication Sig Start Date End Date Taking? Authorizing Provider  gabapentin (NEURONTIN) 300 MG capsule Take 2 tabs TID 02/28/11   Denny Levy, MD     Positive ROS: none  All other systems have been reviewed and were otherwise negative with the exception of those mentioned in the HPI and as above.  Physical Exam: There were no vitals filed for this visit.  General: Alert, no acute distress Cardiovascular: No pedal edema Respiratory: No cyanosis, no use of accessory musculature GI: No organomegaly, abdomen is soft and non-tender Skin: No lesions in the area of chief complaint Neurologic: Sensation intact distally Psychiatric: Patient is competent for consent with normal mood and affect Lymphatic: No axillary or cervical lymphadenopathy  MUSCULOSKELETAL: + phalens/ + tinnels/ pain on rom  Assessment/Plan: right carpal tunnel syndrome Plan for  Procedure(s): CARPAL TUNNEL RELEASE  The risks benefits and alternatives were discussed with the patient including but not limited to the risks of nonoperative treatment, versus surgical intervention including infection, bleeding, nerve injury, malunion, nonunion, hardware prominence, hardware failure, need for hardware removal, blood clots, cardiopulmonary complications, morbidity, mortality, among others, and they were willing to proceed.  Predicted outcome is good, although there will be at least a six to nine month expected recovery.  Mayanna Garlitz L, MD 11/05/2011 9:44 AM

## 2011-11-05 NOTE — Brief Op Note (Signed)
11/05/2011  11:13 AM  PATIENT:  Paige Woodard  51 y.o. female  PRE-OPERATIVE DIAGNOSIS:  right carpal tunnel syndrome  POST-OPERATIVE DIAGNOSIS:  right carpal tunnel syndrome  PROCEDURE:  Procedure(s): CARPAL TUNNEL RELEASE  SURGEON:  Surgeon(s): Harvie Junior, MD  PHYSICIAN ASSISTANT:   ASSISTANTS: bethune    ANESTHESIA:   regional  EBL:     BLOOD ADMINISTERED:none  DRAINS: none   LOCAL MEDICATIONS USED:  MARCAINE 10CC  SPECIMEN:  No Specimen  DISPOSITION OF SPECIMEN:  N/A  COUNTS:  YES  TOURNIQUET:  * Missing tourniquet times found for documented tourniquets in log:  21806 *  DICTATION: .Other Dictation: Dictation Number [id number]  PLAN OF CARE: Discharge to home after PACU  PATIENT DISPOSITION:  PACU - hemodynamically stable.   Delay start of Pharmacological VTE agent (>24hrs) due to surgical blood loss or risk of bleeding:  {YES/NO/NOT APPLICABLE:20182

## 2011-11-05 NOTE — Anesthesia Preprocedure Evaluation (Addendum)
Anesthesia Evaluation  Patient identified by MRN, date of birth, ID band Patient awake    Reviewed: Allergy & Precautions, H&P , NPO status , Patient's Chart, lab work & pertinent test results  Airway Mallampati: II TM Distance: >3 FB Neck ROM: Full    Dental No notable dental hx. (+) Teeth Intact   Pulmonary neg pulmonary ROS,  clear to auscultation  Pulmonary exam normal       Cardiovascular neg cardio ROS Regular Normal    Neuro/Psych Negative Neurological ROS  Negative Psych ROS   GI/Hepatic negative GI ROS, Neg liver ROS,   Endo/Other  Negative Endocrine ROS  Renal/GU negative Renal ROS  Genitourinary negative   Musculoskeletal   Abdominal   Peds  Hematology negative hematology ROS (+)   Anesthesia Other Findings   Reproductive/Obstetrics negative OB ROS                           Anesthesia Physical Anesthesia Plan  ASA: I  Anesthesia Plan: MAC and Bier Block   Post-op Pain Management:    Induction: Intravenous  Airway Management Planned: Simple Face Mask  Additional Equipment:   Intra-op Plan:   Post-operative Plan:   Informed Consent: I have reviewed the patients History and Physical, chart, labs and discussed the procedure including the risks, benefits and alternatives for the proposed anesthesia with the patient or authorized representative who has indicated his/her understanding and acceptance.     Plan Discussed with: CRNA  Anesthesia Plan Comments:         Anesthesia Quick Evaluation

## 2011-11-05 NOTE — Transfer of Care (Signed)
Immediate Anesthesia Transfer of Care Note  Patient: Paige Woodard  Procedure(s) Performed:  CARPAL TUNNEL RELEASE  Patient Location: PACU  Anesthesia Type: Bier block  Level of Consciousness: awake and alert   Airway & Oxygen Therapy: Patient Spontanous Breathing and Patient connected to face mask oxygen  Post-op Assessment: Report given to PACU RN and Post -op Vital signs reviewed and stable  Post vital signs: Reviewed and stable  Complications: No apparent anesthesia complications

## 2011-11-06 ENCOUNTER — Encounter (HOSPITAL_BASED_OUTPATIENT_CLINIC_OR_DEPARTMENT_OTHER): Payer: Self-pay | Admitting: Orthopedic Surgery

## 2011-11-06 NOTE — Op Note (Signed)
NAME:  Paige Woodard, Paige Woodard                  ACCOUNT NO.:  MEDICAL RECORD NO.:  LOCATION:                                 FACILITY:  PHYSICIAN:  Harvie Junior, M.D.        DATE OF BIRTH:  DATE OF PROCEDURE:  11/05/2011 DATE OF DISCHARGE:                              OPERATIVE REPORT   PREOPERATIVE DIAGNOSIS:  Carpal tunnel syndrome, right.  POSTOPERATIVE DIAGNOSE:  Carpal tunnel syndrome, right.  PROCEDURE:  Right carpal tunnel release.  SURGEON:  Harvie Junior, MD  ANESTHESIA:  Forearm based IV regional.  ASSISTANT:  Marshia Ly, PA  BRIEF HISTORY:  She is a 51 year old female on the [facility].  She had a long history of having numbness and tingling in her thumb, index, and long fingers.  She had EMGs which showed that she had a carpal tunnel syndrome.  We talked about treatment options including activity modification.  She had failed nighttime splinting, injection therapy, anti-inflammatory medication.  Because of failure of all conservative care, she was ultimately taken to the operating room for carpal tunnel release.  PROCEDURE:  The patient was taken to the operating room and after adequate level of anesthesia was obtained with forearm based IV regional, the patient was placed supine on the operating table.  The right arm was then prepped and draped in usual sterile fashion. Following this, a curved incision was made just ulnar to the midline wrist crease, subcutaneous tissue down to the level of the volar carpal ligaments which were clearly identified and divided.  A small rent was made inferiorly where it was used to make sure the nerve was not adherent on the under surface.  A curved incision was then used to release the volar carpal ligament in both the proximal and distal direction.  Once this was done, the median nerve was identified, freed up, made sure that the motor branch was also free.  At this point, the wound was copiously and  thoroughly irrigated and suctioned dry.  The gloved finger could be placed in the wound proximal and distal.  Once this was completed, the wound was closed with a combination of interrupted and running 4-0 nylon.  Sterile compressive dressing was applied as well as volar plaster.  The patient was taken to recovery, and she was noted to be in satisfactory condition.  Estimated blood loss for the procedure was none.     Harvie Junior, M.D.     Ranae Plumber  D:  11/05/2011  T:  11/06/2011  Job:  [id number]

## 2012-09-07 ENCOUNTER — Ambulatory Visit (INDEPENDENT_AMBULATORY_CARE_PROVIDER_SITE_OTHER): Payer: BC Managed Care – PPO | Admitting: Sports Medicine

## 2012-09-07 ENCOUNTER — Encounter: Payer: Self-pay | Admitting: Sports Medicine

## 2012-09-07 VITALS — BP 149/94 | HR 71 | Ht 62.0 in | Wt 140.0 lb

## 2012-09-07 DIAGNOSIS — M19049 Primary osteoarthritis, unspecified hand: Secondary | ICD-10-CM

## 2012-09-07 DIAGNOSIS — M19041 Primary osteoarthritis, right hand: Secondary | ICD-10-CM

## 2012-09-07 MED ORDER — DICLOFENAC SODIUM 1 % TD GEL: TRANSDERMAL | Status: DC

## 2012-09-07 NOTE — Assessment & Plan Note (Signed)
She has bony hypertrophy bilateral 1st carpal-metacarpal joints and pain and swelling in her right that is likely secondary to osteoarthritis.  She had received a glucocorticoid injection at her orthopedist's office near the end of August 2013 that did provide significant relief.  We will try to maximize on conservative therapy before another trial of injection. She was advised to try Voltaren gel as needed and to use a thumb spica splint periodically to help rest that joint. She will follow-up in 4-6 weeks as needed if these therapies are not helping and try another glucocorticoid injection.

## 2012-09-07 NOTE — Patient Instructions (Addendum)
Try the anti-inflammatory gel up to 4 times a day as needed  Try a splint to keep your thumb still to rest periodically   Please follow up in 4-6 weeks if you would like a steroid injection

## 2012-09-07 NOTE — Progress Notes (Signed)
  Subjective:    Patient ID: Paige Woodard, female    DOB: 1961/01/13, 51 y.o.   MRN: [id number]  HPI She presents today with worsening thumb pain and swelling.  She had bilateral carpal tunnel symptoms "for a while" before having surgery on 10/2011. The numbness and tingling have since improved.  However, while she was also having these symptoms, she was experiencing right thumb pain. She was evaluated on 082013 by her orthopedist Dr. Luiz Blare who diagnosed her with arthritis and performed a glucocorticoid injection of her [facility] joint. She reported significant improve the days following her injection. Injection was very painful.  Over the past 3 weeks, her pain has started to return. She rates the pain as 5/10 today but it is 8/10 at its worst.  It hurts more with activities such as opening a jar and is stiff in the morning.  It is alleviated by icing and keeping her thumb still. She has tried Tylenol, ibuprofen, and Alleve but none of these medications provide improvement in pain.   Review of Systems  Allergies, medication, past medical history reviewed.  She has a history of arthritis in her knees as well.  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  She is left-handed.  Her pain does not limit her activities. She is able to play tennis (albeit left-handed) without significant problem.     Objective:   Physical Exam GEN: NAD PSYCH: pleasant, normal affect RIGHT HAND: tenderness along 1st carpal-metacarpal joint and noticeable swelling over thenar eminence; "squaring" of both right and left 1st carpal-metacarpal joints evident; pain with resisted flexion and abduction The right hand shows more significant degenerative changes at the [facility] joint  Left hand she has significant changes at the first MCP joint  Both hands reveal minor arthritic changes at the DIP joints     Assessment & Plan:

## 2012-10-14 ENCOUNTER — Ambulatory Visit (INDEPENDENT_AMBULATORY_CARE_PROVIDER_SITE_OTHER): Payer: BC Managed Care – PPO | Admitting: Sports Medicine

## 2012-10-14 VITALS — BP 138/80 | Ht 62.0 in | Wt 140.0 lb

## 2012-10-14 DIAGNOSIS — G5603 Carpal tunnel syndrome, bilateral upper limbs: Secondary | ICD-10-CM | POA: Insufficient documentation

## 2012-10-14 DIAGNOSIS — M19041 Primary osteoarthritis, right hand: Secondary | ICD-10-CM

## 2012-10-14 DIAGNOSIS — M19049 Primary osteoarthritis, unspecified hand: Secondary | ICD-10-CM

## 2012-10-14 DIAGNOSIS — G5600 Carpal tunnel syndrome, unspecified upper limb: Secondary | ICD-10-CM

## 2012-10-14 NOTE — Progress Notes (Signed)
Patient ID: Paige Woodard, female   DOB: 02/01/1961, 52 y.o.   MRN: [id number]   patient returns in followup of right [facility] joint arthritis. She had an injection by Dr. Luiz Blare into this joint that was very painful but gave her good early for her joint symptoms from October until this time Now the pain is limiting the use of her right hand She has trouble holding things in symptoms she'll hit a tennis shot with her right hand and is very weak for that  RT hand is not having much in the way of carpal tunnel symptoms at this time Left hand still has numbness particularly at night  distribution of the median nerve     Physical examination  Ergometer recheck of hand strength Left hand 23 pounds second finger 28 third 21 fourth 25 Right hand thumb 22 second 25 third 22 fourth 18  Right hand has a midline scar from old carpal tunnel syndrome  Right CMC joint is hypertrophied and is tender to touch she feels pain with opposition of thumb and fingers  Left CMC joint does not show as much degenerative change She is now having pain on grip or activity with the left hand at this time She does get numbness at night in the left hand  Neck motion is full without limitation and no symptoms

## 2012-10-14 NOTE — Assessment & Plan Note (Signed)
Continue to use the night splint

## 2012-10-14 NOTE — Assessment & Plan Note (Signed)
Consent obtained and verified. Sterile prep. Furthur cleansed with alcohol.  Joint: RT first CMC Approached in typical fashion with: ultrasound guidance that showed infiltration of medicine into the joint Completed without difficulty Meds: 10 kenalog 0.4cc and 1% lidocaine 0.4cc Needle: 30G and 3/4 inch Aftercare instructions and Red flags advised.  Injection again and hopefully this will give her some relief Long-term options will probably be oral medications Continue to use heat and easy grip exercises Recheck pending response

## 2012-12-06 ENCOUNTER — Ambulatory Visit (INDEPENDENT_AMBULATORY_CARE_PROVIDER_SITE_OTHER): Payer: BC Managed Care – PPO | Admitting: Sports Medicine

## 2012-12-06 VITALS — BP 150/85 | Ht 62.0 in | Wt 135.0 lb

## 2012-12-06 DIAGNOSIS — M79609 Pain in unspecified limb: Secondary | ICD-10-CM

## 2012-12-06 DIAGNOSIS — M79642 Pain in left hand: Secondary | ICD-10-CM

## 2012-12-06 NOTE — Progress Notes (Signed)
  Subjective:    Patient ID: Paige Woodard, female    DOB: November 19, 1960, 52 y.o.   MRN: [id number]  HPI Paige Woodard is a 52-y.o. established patient who presents to clinic with a new complaint of left hand pain x4 days.  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  Pain began acutely while she was playing tennis, and progressed in the following days.  She denies any trauma or inciting event related to onset.  She describes the pain as sharp, located on the dorsum of the left hand, and radiating to the left wrist.  Additionally notes some "burning" pain in the left shoulder blade and back.  Denies paresthesias, weakness, or swelling.  She taped her hand around the area of pain yesterday to play tennis and received almost full relief.  Pain is somewhat relieved by Ibuprofen.  She has a history of right-sided carpal tunnel and arthritis, but states that her symptoms are different from anything she has experienced before.   Review of Systems Negative except as noted above.    Objective:   Physical Exam General: well-appearing, well-nourished. Left hand: Inspection reveals no gross deformity.  No tenderness to palpation over the dorsum of the left hand.  Strength 5/5.  Full range of motion of the digits and left wrist.  Negative Tinel's.  Negative Phalen's.  Neurovascularly intact.  Ultrasound: Ultrasound of the 3rd and 4th CMC and metacarpal joints was performed in office today.  Exam showed some fluid accumulation and inflammation of the 3rd CMC joint, No signs of inflammation of the 4th CMC joint.  No apparent fracture or cyst formation.  Tendons intact.  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  Patient was advised to continuing taping the area of pain and to consider an OTC pain relief ointment for application to the dorsum of the hand.  Educated on the proper use of Ibuprofen for pain  relief.  She may continue to play tennis.  Patient to follow-up as needed.

## 2013-01-24 ENCOUNTER — Encounter: Payer: Self-pay | Admitting: Gastroenterology

## 2013-02-22 ENCOUNTER — Ambulatory Visit (AMBULATORY_SURGERY_CENTER): Payer: BC Managed Care – PPO

## 2013-02-22 VITALS — Ht 62.0 in | Wt 148.0 lb

## 2013-02-22 DIAGNOSIS — Z1211 Encounter for screening for malignant neoplasm of colon: Secondary | ICD-10-CM

## 2013-02-22 MED ORDER — MOVIPREP 100 G PO SOLR: ORAL | Status: DC

## 2013-02-23 ENCOUNTER — Encounter: Payer: Self-pay | Admitting: Gastroenterology

## 2013-03-08 ENCOUNTER — Other Ambulatory Visit: Payer: BC Managed Care – PPO | Admitting: Gastroenterology

## 2014-08-01 ENCOUNTER — Ambulatory Visit (INDEPENDENT_AMBULATORY_CARE_PROVIDER_SITE_OTHER): Payer: BC Managed Care – PPO | Admitting: Sports Medicine

## 2014-08-01 VITALS — BP 152/92 | Ht 62.0 in | Wt 135.0 lb

## 2014-08-01 DIAGNOSIS — G8929 Other chronic pain: Secondary | ICD-10-CM | POA: Diagnosis not present

## 2014-08-01 DIAGNOSIS — M545 Low back pain, unspecified: Secondary | ICD-10-CM | POA: Insufficient documentation

## 2014-08-01 NOTE — Progress Notes (Signed)
Patient ID: Paige AbrahamNancy A Dayley, female   DOB: 03-20-1961, 53 y.o.   MRN: [id number]  Patient enters for evaluation of low back left hip and left knee pain This is associated with some stiffness in the morning The left kneecap sometimes feels that it is out of place particularly in the mornings  She plays tennis 3-5 times a week and this has not limited her She does not recall a recent acute injury She did have a fall on her left buttocks several years ago That is an area that continues to be somewhat painful at times  A second issue is her hand arthritis She has seen Dr. Merlyn LotKuzma and is using a night splint and she uses glucosamine both of which seemed to help  Physical examination No acute distress BP 152/92 mmHg  Ht 5\' 2"  (1.575 m)  Wt 135 lb (61.236 kg)  BMI 24.69 kg/m2  Both hips reveal full range of motion  she has excellent strength FABER is normal bilaterally However rotation and motion on the left is slightly less than on the right  Low back motion is normal but she is some mild pain with extension beyond 30 Straight leg raise is good to 90 bilaterally with no pain Sensation is normal  Left Knee: Normal to inspection with no erythema or effusion or obvious bony abnormalities. Palpation normal with no warmth or joint line tenderness or patellar tenderness or condyle tenderness. ROM normal in flexion and extension and lower leg rotation. Ligaments with solid consistent endpoints including ACL, PCL, LCL, MCL. Negative Mcmurray's and provocative meniscal tests. Non painful patellar compression. Patellar and quadriceps tendons unremarkable. Hamstring and quadriceps strength is normal.  Impression Low back pain This seems primarily mechanical. She does not show any discogenic features. I suggested trying a home exercise program and use this for 6 weeks. She will also try yoga. If she's not improving I would like to x-ray her low back  Left hip and knee pain I think these  are more related to muscle imbalance than they are to any arthritis or joint I did not detect signs of a specific injury I suggested a couple of hip exercises to try to balance the motion and strength on the left side so it is equal to the right She will also do some biking to rehabilitate the left knee  Followup If not improving I would like to have her return in 6 weeks

## 2014-09-06 ENCOUNTER — Ambulatory Visit (INDEPENDENT_AMBULATORY_CARE_PROVIDER_SITE_OTHER): Payer: BC Managed Care – PPO | Admitting: Sports Medicine

## 2014-09-06 VITALS — BP 157/84 | Ht 62.0 in | Wt 135.0 lb

## 2014-09-06 DIAGNOSIS — M25562 Pain in left knee: Secondary | ICD-10-CM | POA: Diagnosis not present

## 2014-09-06 DIAGNOSIS — M23301 Other meniscus derangements, unspecified lateral meniscus, left knee: Secondary | ICD-10-CM | POA: Diagnosis not present

## 2014-09-06 DIAGNOSIS — M23304 Other meniscus derangements, unspecified medial meniscus, left knee: Secondary | ICD-10-CM | POA: Diagnosis not present

## 2014-09-06 DIAGNOSIS — M23307 Other meniscus derangements, unspecified meniscus, left knee: Secondary | ICD-10-CM

## 2014-09-06 DIAGNOSIS — M23309 Other meniscus derangements, unspecified meniscus, unspecified knee: Secondary | ICD-10-CM | POA: Insufficient documentation

## 2014-09-06 NOTE — Patient Instructions (Signed)
Use a compression sleeve for knee when active or standing You don't need it for biking  Biking is the key rehab for this - try 30 mins. Most days  Use aleve/ advil if you need for pain  Icing may help  We should check this again in 4 to 6 weeks  OK to try some tennis in 3 to 4 weeks if it is not giving out

## 2014-09-06 NOTE — Progress Notes (Signed)
Patient ID: Paige AbrahamNancy A Woodard, female   DOB: 02-22-1961, 53 y.o.   MRN: [id number]  Patient was playing tennis Left knee gave way Caused her to fall landing on RT shin For past week knee has been more painful Gets sharp but brief locking No more giving way  Hx of some chronic stiffness in mornings but never had these sxs  NAD BP 157/84 mmHg  Ht 5\' 2"  (1.575 m)  Wt 135 lb (61.236 kg)  BMI 24.69 kg/m2  Knee LT  no erythema but mild effusion / no obvious bony abnormalities. Palpation normal with no warmth or joint line tenderness or patellar tenderness or condyle tenderness. ROM normal but pain on full  Flexion  extension norm and lower leg rotation.norm Ligaments with solid consistent endpoints including ACL, PCL, LCL, MCL. Negative Mcmurray's and provocative meniscal tests. Non painful patellar compression. Patellar and quadriceps tendons unremarkable. Hamstring and quadriceps strength is normal.  Hip abduction is strong  Thessaly and 1 leg knee bend cause some pain  US Effusion on left none on RT in SPP No real spurring  QT and PT are normal MM bulging and in post area has swelling and small Baker's cyst Lat Men is normal midline but in post area has some splitting

## 2014-09-06 NOTE — Assessment & Plan Note (Signed)
Use compression sleeve  No tennis or wt bearing activity for 3 wks  xtrain on bike and some SLR  After 3 weeks if no mechanical sxs can ease into some tennis  Reck 4 wks and rescan  If mechanical sxs don't resolve - would need arthroscopy

## 2014-09-18 ENCOUNTER — Ambulatory Visit (INDEPENDENT_AMBULATORY_CARE_PROVIDER_SITE_OTHER): Payer: BC Managed Care – PPO | Admitting: Sports Medicine

## 2014-09-18 ENCOUNTER — Encounter: Payer: Self-pay | Admitting: Sports Medicine

## 2014-09-18 ENCOUNTER — Ambulatory Visit
Admission: RE | Admit: 2014-09-18 | Discharge: 2014-09-18 | Disposition: A | Discharge status: Home / Self Care | Payer: BC Managed Care – PPO | Source: Ambulatory Visit | Attending: Sports Medicine | Admitting: Sports Medicine

## 2014-09-18 VITALS — BP 128/90 | HR 73 | Ht 62.0 in | Wt 133.0 lb

## 2014-09-18 DIAGNOSIS — M23307 Other meniscus derangements, unspecified meniscus, left knee: Secondary | ICD-10-CM

## 2014-09-18 DIAGNOSIS — M23301 Other meniscus derangements, unspecified lateral meniscus, left knee: Secondary | ICD-10-CM | POA: Diagnosis not present

## 2014-09-18 DIAGNOSIS — M23304 Other meniscus derangements, unspecified medial meniscus, left knee: Secondary | ICD-10-CM | POA: Diagnosis not present

## 2014-09-18 MED ORDER — MELOXICAM 15 MG PO TABS: 15.0000 mg | ORAL_TABLET | Freq: Every day | ORAL | Status: DC

## 2014-09-18 NOTE — Progress Notes (Signed)
   Subjective:    Patient ID: Paige Woodard, female    DOB: [birth year], 53 y.o.   MRN: [id number]  HPI  Left knee pain:  Patient returns to sports medicine clinic for follow-up to left knee pain. 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 Original  Injury occurred while the patient was playing tennis. Left knee gave away at that time causing her to fall and land on her right shin. At that time she experienced sharp pains with locking.  Patient states she is extremely worse, that her last appointment. She has experienced swelling in her left knee with pain located  Inferior kneecap and posterior knee pain. Pt states her pain becomes worse progressively throughout the day. Better  with sitting. She notices she needs to be careful with transitioning from sitting to standing, as her knee is unstable and she has experienced a sharp pain with a click.  She is having extreme difficulty with steps both up and down. There are days where she can't even walk without pain. She is taking 1000 mg a day of Advil, wearing her compression sleeve and icing regularly.  Never smoker Past Medical History  Diagnosis Date  . No pertinent past medical history   . Neuromuscular disorder     carpal tunnel / Bil   Allergies  Allergen Reactions  . Bee Pollen     Anaphylaxis  . Codeine Anaphylaxis  . Poison Ivy Extract [Extract Of Poison Ivy]     Acute reaction  . Hydrocodone Itching  . Oxycodone Itching   Review of Systems Per HPI    Objective:   Physical Exam BP 128/90 mmHg  Pulse 73  Ht 5\' 2"  (1.575 m)  Wt 133 lb (60.328 kg)  BMI 24.32 kg/m2 Gen: Caucasian female, no acute distress, nontoxic in appearance, well-developed, well-nourished,  Alert, oriented 3 MSK:  No erythema, small effusion present,  smallBaker cyst palpated.  No bony tenderness, no joint line tenderness.  Positive crepitus with extension bilaterally. Range of Motion normal, with the  exception of decreased full flexion, with pain.  No ligament laxity. Negative Lachman's. Negative McMurray's. 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  Spur off patella.    Assessment & Plan:  Paige Abrahamancy A Pappalardo is a 53 y.o. female with degenerative left knee pain,  Consistent with arthritis versus degenerative meniscus. -   meloxicam 15 mg a day -   continue compression sleeve with activity -  X-ray today : Standing PA and lateral with sunrise view. Patient will be called with results, and further plan will be decided.  Consideration for possible MRI.  Based on some instability with only mild DJD I think we need to proceed with MRI.

## 2014-09-18 NOTE — Assessment & Plan Note (Signed)
Suspect this is mostly Deg MM  However does have mild DJD  We will cont conservative care  Schedule mii

## 2014-09-19 ENCOUNTER — Other Ambulatory Visit: Payer: Self-pay | Admitting: *Deleted

## 2014-09-19 MED ORDER — DIAZEPAM 5 MG PO TABS: ORAL_TABLET | ORAL | Status: DC

## 2014-09-19 NOTE — Addendum Note (Signed)
Addended by: Annita BrodMOORE, Devan Babino C on: 09/19/2014 09:00 AM   Modules accepted: Orders

## 2014-09-24 ENCOUNTER — Inpatient Hospital Stay: Admission: RE | Admit: 2014-09-24 | Payer: BC Managed Care – PPO | Source: Ambulatory Visit

## 2014-09-26 ENCOUNTER — Other Ambulatory Visit: Payer: BC Managed Care – PPO

## 2014-09-30 ENCOUNTER — Ambulatory Visit
Admission: RE | Admit: 2014-09-30 | Discharge: 2014-09-30 | Disposition: A | Discharge status: Home / Self Care | Payer: BC Managed Care – PPO | Source: Ambulatory Visit | Attending: Sports Medicine | Admitting: Sports Medicine

## 2014-09-30 DIAGNOSIS — M23307 Other meniscus derangements, unspecified meniscus, left knee: Secondary | ICD-10-CM

## 2014-10-09 ENCOUNTER — Encounter (HOSPITAL_COMMUNITY): Payer: Self-pay | Admitting: Emergency Medicine

## 2014-10-09 ENCOUNTER — Emergency Department (HOSPITAL_COMMUNITY)
Admission: EM | Admit: 2014-10-09 | Discharge: 2014-10-09 | Disposition: A | Discharge status: Home / Self Care | Payer: BLUE CROSS/BLUE SHIELD | Source: Home / Self Care | Attending: Family Medicine | Admitting: Family Medicine

## 2014-10-09 ENCOUNTER — Other Ambulatory Visit: Payer: Self-pay | Admitting: *Deleted

## 2014-10-09 ENCOUNTER — Ambulatory Visit (HOSPITAL_COMMUNITY)
Admit: 2014-10-09 | Discharge: 2014-10-09 | Disposition: A | Discharge status: Home / Self Care | Payer: BLUE CROSS/BLUE SHIELD | Source: Ambulatory Visit | Attending: Sports Medicine | Admitting: Sports Medicine

## 2014-10-09 DIAGNOSIS — M7989 Other specified soft tissue disorders: Secondary | ICD-10-CM

## 2014-10-09 DIAGNOSIS — M7122 Synovial cyst of popliteal space [Baker], left knee: Secondary | ICD-10-CM | POA: Insufficient documentation

## 2014-10-09 NOTE — Progress Notes (Signed)
*  PRELIMINARY RESULTS* Vascular Ultrasound Left lower extremity venous duplex has been completed.  Preliminary findings: No evidence of DVT. Baker's cyst noted.  Called results to Dr. Denyse Amassorey.    Farrel DemarkJill Eunice, RDMS, RVT  10/09/2014, 12:39 PM

## 2014-10-09 NOTE — Discharge Instructions (Signed)
Thank you for coming in today. Go to the main hospital check in and states that your here for a vascular ultrasound study.  I will call you with results  Deep Vein Thrombosis A deep vein thrombosis (DVT) is a blood clot that develops in the deep, larger veins of the leg, arm, or pelvis. These are more dangerous than clots that might form in veins near the surface of the body. A DVT can lead to serious and even life-threatening complications if the clot breaks off and travels in the bloodstream to the lungs.  A DVT can damage the valves in your leg veins so that instead of flowing upward, the blood pools in the lower leg. This is called post-thrombotic syndrome, and it can result in pain, swelling, discoloration, and sores on the leg. CAUSES Usually, several things contribute to the formation of blood clots. Contributing factors include:  The flow of blood slows down.  The inside of the vein is damaged in some way.  You have a condition that makes blood clot more easily. RISK FACTORS Some people are more likely than others to develop blood clots. Risk factors include:   Smoking.  Being overweight (obese).  Sitting or lying still for a long time. This includes long-distance travel, paralysis, or recovery from an illness or surgery. Other factors that increase risk are:   Older age, especially over 55 years of age.  Having a family history of blood clots or if you have already had a blot clot.  Having major or lengthy surgery. This is especially true for surgery on the hip, knee, or belly (abdomen). Hip surgery is particularly high risk.  Having a long, thin tube (catheter) placed inside a vein during a medical procedure.  Breaking a hip or leg.  Having cancer or cancer treatment.  Pregnancy and childbirth.  Hormone changes make the blood clot more easily during pregnancy.  The fetus puts pressure on the veins of the pelvis.  There is a risk of injury to veins during delivery  or a caesarean delivery. The risk is highest just after childbirth.  Medicines containing the female hormone estrogen. This includes birth control pills and hormone replacement therapy.  Other circulation or heart problems.  SIGNS AND SYMPTOMS When a clot forms, it can either partially or totally block the blood flow in that vein. Symptoms of a DVT can include:  Swelling of the leg or arm, especially if one side is much worse.  Warmth and redness of the leg or arm, especially if one side is much worse.  Pain in an arm or leg. If the clot is in the leg, symptoms may be more noticeable or worse when standing or walking. The symptoms of a DVT that has traveled to the lungs (pulmonary embolism, PE) usually start suddenly and include:  Shortness of breath.  Coughing.  Coughing up blood or blood-tinged mucus.  Chest pain. The chest pain is often worse with deep breaths.  Rapid heartbeat. Anyone with these symptoms should get emergency medical treatment right away. Do not wait to see if the symptoms will go away. Call your local emergency services (911 in the [place] if you have these symptoms. Do not drive yourself to the hospital. DIAGNOSIS If a DVT is suspected, your health care provider will take a full medical history and perform a physical exam. Tests that also may be required include:  Blood tests, including studies of the clotting properties of the blood.  Ultrasound to see if you have  clots in your legs or lungs.  X-rays to show the flow of blood when dye is injected into the veins (venogram).  Studies of your lungs if you have any chest symptoms. PREVENTION  Exercise the legs regularly. Take a brisk 30-minute walk every day.  Maintain a weight that is appropriate for your height.  Avoid sitting or lying in bed for long periods of time without moving your legs.  Women, particularly those over the age of 58 years, should consider the risks and benefits of taking estrogen  medicines, including birth control pills.  Do not smoke, especially if you take estrogen medicines.  Long-distance travel can increase your risk of DVT. You should exercise your legs by walking or pumping the muscles every hour.  Many of the risk factors above relate to situations that exist with hospitalization, either for illness, injury, or elective surgery. Prevention may include medical and nonmedical measures.  Your health care provider will assess you for the need for venous thromboembolism prevention when you are admitted to the hospital. If you are having surgery, your surgeon will assess you the day of or day after surgery. TREATMENT Once identified, a DVT can be treated. It can also be prevented in some circumstances. Once you have had a DVT, you may be at increased risk for a DVT in the future. The most common treatment for DVT is blood-thinning (anticoagulant) medicine, which reduces the blood's tendency to clot. Anticoagulants can stop new blood clots from forming and stop old clots from growing. They cannot dissolve existing clots. Your body does this by itself over time. Anticoagulants can be given by mouth, through an IV tube, or by injection. Your health care provider will determine the best program for you. Other medicines or treatments that may be used are:  Heparin or related medicines (low molecular weight heparin) are often the first treatment for a blood clot. They act quickly. However, they cannot be taken orally and must be given either in shot form or by IV tube.  Heparin can cause a fall in a component of blood that stops bleeding and forms blood clots (platelets). You will be monitored with blood tests to be sure this does not occur.  Warfarin is an anticoagulant that can be swallowed. It takes a few days to start working, so usually heparin or related medicines are used in combination. Once warfarin is working, heparin is usually stopped.  Factor Xa inhibitor  medicines, such as rivaroxaban and apixaban, also reduce blood clotting. These medicines are taken orally and can often be used without heparin or related medicines.  Less commonly, clot dissolving drugs (thrombolytics) are used to dissolve a DVT. They carry a high risk of bleeding, so they are used mainly in severe cases where your life or a part of your body is threatened.  Very rarely, a blood clot in the leg needs to be removed surgically.  If you are unable to take anticoagulants, your health care provider may arrange for you to have a filter placed in a main vein in your abdomen. This filter prevents clots from traveling to your lungs. HOME CARE INSTRUCTIONS  Take all medicines as directed by your health care provider.  Learn as much as you can about DVT.  Wear a medical alert bracelet or carry a medical alert card.  Ask your health care provider how soon you can go back to normal activities. It is important to stay active to prevent blood clots. If you are on anticoagulant medicine,  avoid contact sports.  It is very important to exercise. This is especially important while traveling, sitting, or standing for long periods of time. Exercise your legs by walking or by tightening and relaxing your leg muscles regularly. Take frequent walks.  You may need to wear compression stockings. These are tight elastic stockings that apply pressure to the lower legs. This pressure can help keep the blood in the legs from clotting. Taking Warfarin Warfarin is a daily medicine that is taken by mouth. Your health care provider will advise you on the length of treatment (usually 3-6 months, sometimes lifelong). If you take warfarin:  Understand how to take warfarin and foods that can affect how warfarin works in Veterinary surgeon.  Too much and too little warfarin are both dangerous. Too much warfarin increases the risk of bleeding. Too little warfarin continues to allow the risk for blood clots. Warfarin and  Regular Blood Testing While taking warfarin, you will need to have regular blood tests to measure your blood clotting time. These blood tests usually include both the prothrombin time (PT) and international normalized ratio (INR) tests. The PT and INR results allow your health care provider to adjust your dose of warfarin. It is very important that you have your PT and INR tested as often as directed by your health care provider.  Warfarin and Your Diet Avoid major changes in your diet, or notify your health care provider before changing your diet. Arrange a visit with a registered dietitian to answer your questions. Many foods, especially foods high in vitamin K, can interfere with warfarin and affect the PT and INR results. You should eat a consistent amount of foods high in vitamin K. Foods high in vitamin K include:   Spinach, kale, broccoli, cabbage, collard and turnip greens, Brussels sprouts, peas, cauliflower, seaweed, and parsley.  Beef and pork liver.  Green tea.  Soybean oil. Warfarin with Other Medicines Many medicines can interfere with warfarin and affect the PT and INR results. You must:  Tell your health care provider about any and all medicines, vitamins, and supplements you take, including aspirin and other over-the-counter anti-inflammatory medicines. Be especially cautious with aspirin and anti-inflammatory medicines. Ask your health care provider before taking these.  Do not take or discontinue any prescribed or over-the-counter medicine except on the advice of your health care provider or pharmacist. Warfarin Side Effects Warfarin can have side effects, such as easy bruising and difficulty stopping bleeding. Ask your health care provider or pharmacist about other side effects of warfarin. You will need to:  Hold pressure over cuts for longer than usual.  Notify your dentist and other health care providers that you are taking warfarin before you undergo any procedures  where bleeding may occur. Warfarin with Alcohol and Tobacco   Drinking alcohol frequently can increase the effect of warfarin, leading to excess bleeding. It is best to avoid alcoholic drinks or to consume only very small amounts while taking warfarin. Notify your health care provider if you change your alcohol intake.   Do not use any tobacco products including cigarettes, chewing tobacco, or electronic cigarettes. If you smoke, quit. Ask your health care provider for help with quitting smoking. Alternative Medicines to Warfarin: Factor Xa Inhibitor Medicines  These blood-thinning medicines are taken by mouth, usually for several weeks or longer. It is important to take the medicine every single day at the same time each day.  There are no regular blood tests required when using these medicines.  There  are fewer food and drug interactions than with warfarin.  The side effects of this class of medicine are similar to those of warfarin, including excessive bruising or bleeding. Ask your health care provider or pharmacist about other potential side effects. SEEK MEDICAL CARE IF:  You notice a rapid heartbeat.  You feel weaker or more tired than usual.  You feel faint.  You notice increased bruising.  You feel your symptoms are not getting better in the time expected.  You believe you are having side effects of medicine. SEEK IMMEDIATE MEDICAL CARE IF:  You have chest pain.  You have trouble breathing.  You have new or increased swelling or pain in one leg.  You cough up blood.  You notice blood in vomit, in a bowel movement, or in urine. MAKE SURE YOU:  Understand these instructions.  Will watch your condition.  Will get help right away if you are not doing well or get worse. Document Released: 09/15/2005 Document Revised: 01/30/2014 Document Reviewed: 05/23/2013 [facility] Patient Information 2015 [place], [place]. This information is not intended to replace advice given  to you by your health care provider. Make sure you discuss any questions you have with your health care provider.

## 2014-10-09 NOTE — ED Provider Notes (Signed)
Paige Woodard is a 54 y.o. female who presents to Urgent Care today for left leg swelling. Patient developed left calf swelling yesterday. She is being treated for a left knee meniscus tear with compression sleeve and home exercises.  She denies any shortness of breath chest pain or palpitations. She called [facility] who has been managing her knee pain and was advised to present to urgent care for DVT assessment.   Past Medical History  Diagnosis Date  . No pertinent past medical history   . Neuromuscular disorder     carpal tunnel / Bil   Past Surgical History  Procedure Laterality Date  . Dilation and curettage of uterus    . Tonsillectomy    . Carpal tunnel release  11/05/2011    Procedure: CARPAL TUNNEL RELEASE;  Surgeon: Alta Corning, MD;  Location: [facility];  Service: Orthopedics;  Laterality: Right;   History  Substance Use Topics  . Smoking status: Never Smoker   . Smokeless tobacco: Never Used  . Alcohol Use: 6.0 oz/week    10 Glasses of wine per week     Comment: occ   ROS as above Medications: No current facility-administered medications for this encounter.   Current Outpatient Prescriptions  Medication Sig Dispense Refill  . meloxicam (MOBIC) 15 MG tablet Take 1 tablet (15 mg total) by mouth daily. 30 tablet 2  . diazepam (VALIUM) 5 MG tablet Take one tab one hour before your MRI appt time 2 tablet 0  . diclofenac sodium (VOLTAREN) 1 % GEL Use 0.5 g to thumb area on left hand up to four times a day as needed 1 Tube 1  . Diethylpropion HCl (TENUATE PO) Take by mouth daily as needed.    . gabapentin (NEURONTIN) 300 MG capsule Take 2 tabs TID 180 capsule 2  . ibuprofen (ADVIL,MOTRIN) 200 MG tablet Take 200 mg by mouth every 6 (six) hours as needed.    Marland Kitchen MOVIPREP 100 G SOLR Moviprep as directed / no substitutions 1 kit 0   Allergies  Allergen Reactions  . Bee Pollen     Anaphylaxis  . Codeine Anaphylaxis  . Poison Ivy Extract  [Extract Of Poison Ivy]     Acute reaction  . Hydrocodone Itching  . Oxycodone Itching     Exam:  BP 143/95 mmHg  Pulse 70  Temp(Src) 98.3 F (36.8 C) (Oral)  Resp 16  SpO2 100% Gen: Well NAD Left leg: Moderate left calf swelling with no significant edema. Large compared to right. No palpable cords or calf tenderness.  No results found for this or any previous visit (from the past 24 hour(s)). No results found.  Assessment and Plan: 54 y.o. female with left leg swelling. Ruptured Baker's cyst versus DVT. Patient is scheduled for an outpatient vascular ultrasound (ordered by Dr. Micheline Chapman at [facility] sports medicine). I will be called with results and will treat accordingly  Discussed warning signs or symptoms. Please see discharge instructions. Patient expresses understanding.     Gregor Hams, MD 10/09/14 1213

## 2014-10-09 NOTE — ED Notes (Signed)
Pt was diagnosed with a torn meniscus and broken cartilage in her left knee on 09/30/14.  Pt has been wearing a compression sleeve on her knee and doing PT on a stationary bike.  She noticed some discomfort with this last night and then noticed swelling in her knee and ankle this morning.  Her PCP recommended she come here to rule out a blood clot.

## 2014-10-11 ENCOUNTER — Ambulatory Visit (INDEPENDENT_AMBULATORY_CARE_PROVIDER_SITE_OTHER): Payer: BLUE CROSS/BLUE SHIELD | Admitting: Sports Medicine

## 2014-10-11 ENCOUNTER — Encounter: Payer: Self-pay | Admitting: Sports Medicine

## 2014-10-11 VITALS — BP 133/91 | Ht 62.0 in | Wt 135.0 lb

## 2014-10-11 DIAGNOSIS — M23304 Other meniscus derangements, unspecified medial meniscus, left knee: Secondary | ICD-10-CM

## 2014-10-11 DIAGNOSIS — M23301 Other meniscus derangements, unspecified lateral meniscus, left knee: Secondary | ICD-10-CM

## 2014-10-11 DIAGNOSIS — M23307 Other meniscus derangements, unspecified meniscus, left knee: Secondary | ICD-10-CM

## 2014-10-11 MED ORDER — TRAMADOL HCL 50 MG PO TABS: 50.0000 mg | ORAL_TABLET | Freq: Four times a day (QID) | ORAL | Status: DC | PRN

## 2014-10-11 NOTE — Progress Notes (Addendum)
   Subjective:    Patient ID: Paige Woodard, female    DOB: 12-26-1960, 54 y.o.   MRN: [id number]  HPI Paige Woodard is a 54 year old female who returns to sports medicine clinic for follow-up to left knee pain. 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 Initial injury occurred while the patient was playing tennis. She was initially improving with use of a compression sleeve and meloxicam. She then went out and played tennis earlier this month, but then had a setback with sudden recurrence of left knee pain. She says that the left knee and lower extremity had sudden pain and swelling. She had an ultrasound at the hospital, which ruled out DVT. She has continued to use a stationary bike since that time. She has obtained an MRI of the left knee. She says that her pain is gradually improving. Symptoms are aggravated with twisting, bending, or getting up from a seated position. Location of pain is primarily posterior. Her pain will wake her up at night. She is frustrated that she has not been able to return to much tennis activity.   Past medical history, social history, medications, and allergies were reviewed and are up to date in the chart.  Review of Systems 7 point review of systems was performed and was otherwise negative unless noted in the history of present illness.     Objective:   Physical Exam BP 133/91 mmHg  Ht 5\' 2"  (1.575 m)  Wt 135 lb (61.236 kg)  BMI 24.69 kg/m2 GEN: The patient is well-developed well-nourished female and in no acute distress.  She is awake alert and oriented x3. SKIN: warm and well-perfused, no rash  EXTR: No lower extremity edema or calf tenderness Neuro: Strength 5/5 globally. Sensation intact throughout. No focal deficits. Vasc: +2 bilateral distal pulses. No edema.  MSK: Examination of the left knee reveals trace effusion, as well as swelling throughout the lower extremity. No calf pain or tenderness. Mild warmth. Range of motion  is from 0-120 with mild pain at the endpoint of motion. She has tenderness to palpation over the medial joint line, though plantar maximal tenderness is posterior medial knee. No tenderness along the IT band. Negative patellar grind test. No laxity with valgus or varus testing.  MRI Left Knee 09/30/14 IMPRESSION: 1. Radial tear involving the posterior horn of the medial meniscus at the meniscal root with associated medial protrusion of the meniscus and intrasubstance degenerative changes. 2. Intact ligamentous structures and no acute bony findings. 3. MCL and pes anserine bursitis. 4. Moderate-sized joint effusion with moderate synovitis. There is also a moderate-sized partially leaking Baker's cyst.   Limited musculoskeletal ultrasound: Long and short axis views were obtained of left knee. 5 cm collection of hypoechoic fluid seen in the suprapatellar pouch. Anterior medial meniscus appears normal. Posterior medial meniscus with a small tear seen with slight surrounding fluid. There is a small involuting Baker's cyst with fluid in the myofascial planes posteriorly.  She has fairly well-preserved joint space. The remainder of the knee ultrasound was normal.    Assessment & Plan:  Please see problem based assessment and plan in the problem list.

## 2014-10-11 NOTE — Assessment & Plan Note (Addendum)
Involuting Baker's cyst. MRI on 09/30/14 consistent with degenerative meniscal tear. No significant mechanical symptoms today. -She will raise the seat height on her bike to decrease amount of knee been doing exercise -Continue ice, elevation, and compression sleeve -She may gradually begin gentle tennis activities, but should hold off until her pain is only mild -At the end of her visits, she requested something stronger for pain to help her sleep at night. She has tried Tylenol and anti-inflammatories. There is an anaphylactic reaction listed to codeine. When I asked her about this, however she says that she is able to take tramadol and has taken this in the past without significant problems. I explained that any stronger pain medication would likely include some codeine, but the one with least likelihood to cross-react with be the tramadol that she has taken in the past. I cautioned her that there is still the possibility of an allergic reaction to taking tramadol. Despite this, she requested a prescription for this and I provided it for her today. -She will follow-up on an as-needed basis.

## 2014-10-26 ENCOUNTER — Ambulatory Visit (INDEPENDENT_AMBULATORY_CARE_PROVIDER_SITE_OTHER): Payer: BLUE CROSS/BLUE SHIELD | Admitting: Sports Medicine

## 2014-10-26 ENCOUNTER — Encounter: Payer: Self-pay | Admitting: Sports Medicine

## 2014-10-26 VITALS — BP 142/81 | Ht 62.0 in | Wt 133.0 lb

## 2014-10-26 DIAGNOSIS — M23307 Other meniscus derangements, unspecified meniscus, left knee: Secondary | ICD-10-CM

## 2014-10-26 DIAGNOSIS — M23301 Other meniscus derangements, unspecified lateral meniscus, left knee: Secondary | ICD-10-CM

## 2014-10-26 DIAGNOSIS — M23304 Other meniscus derangements, unspecified medial meniscus, left knee: Secondary | ICD-10-CM

## 2014-10-26 MED ORDER — AMITRIPTYLINE HCL 25 MG PO TABS: 25.0000 mg | ORAL_TABLET | Freq: Every day | ORAL | Status: DC

## 2014-10-26 NOTE — Assessment & Plan Note (Signed)
Based on her MRI in December 2015 she has moderate arthritic changes along with the degeneration of the meniscus and a significant tear This fits with the swelling and the Baker cyst found on ultrasound  She is a candidate to consider injection with steroids If this does not improve she may want to consider a surgical opinion although she is reluctant at this time  Another option is to fenestrate and drain the Baker cyst   At this time she plans to continue conservative care and we will start her on amitriptyline at night to help with pain and sleep Continue meloxicam

## 2014-10-26 NOTE — Progress Notes (Signed)
   Subjective:    Patient ID: Paige Woodard, female    DOB: March 05, 1961, 54 y.o.   MRN: [id number]  HPI Knee osteoarthritis on the left side as documented on MRI Since last visit she has not had much relief She does straight leg raises She is trying to do the stationary bike but it hurts in certain positions Tennis hurts but she placed some doubles  Compression sleeve does help Tramadol which she is taking in the past// started itching when she used it this time Meloxicam does help the pain   Review of Systems No systemic illnesses or history of inflammatory arthritis. Both her brother and mother have had knee replacements    Objective:   Physical Exam Pleasant female in no acute distress BP 142/81 mmHg  Ht 5\' 2"  (1.575 m)  Wt 133 lb (60.328 kg)  BMI 24.32 kg/m2  Left knee shows an obvious effusion with mild warmth Extension is limited to - 3 Flexion is limited to 120 before she experiences pain Knee is stable to testing Rotation does cause some pain  Ultrasound examination Large Baker cyst about 4 cm in length and 2 cm in width Moderate to large suprapatellar pouch effusion that extends more to the lateral pouch      Assessment & Plan:

## 2014-11-14 ENCOUNTER — Ambulatory Visit (INDEPENDENT_AMBULATORY_CARE_PROVIDER_SITE_OTHER): Payer: BLUE CROSS/BLUE SHIELD | Admitting: Sports Medicine

## 2014-11-14 VITALS — BP 143/74 | HR 80 | Ht 62.0 in | Wt 125.0 lb

## 2014-11-14 DIAGNOSIS — M25562 Pain in left knee: Secondary | ICD-10-CM

## 2014-11-14 DIAGNOSIS — M7120 Synovial cyst of popliteal space [Baker], unspecified knee: Secondary | ICD-10-CM | POA: Insufficient documentation

## 2014-11-14 DIAGNOSIS — M7122 Synovial cyst of popliteal space [Baker], left knee: Secondary | ICD-10-CM

## 2014-11-14 MED ORDER — METHYLPREDNISOLONE ACETATE 40 MG/ML IJ SUSP
40.0000 mg | Freq: Once | INTRAMUSCULAR | Status: AC
  Administered 2014-11-14: 40 mg via INTRA_ARTICULAR

## 2014-11-14 NOTE — Progress Notes (Signed)
Patient ID: Paige Woodard, female   DOB: 1961/06/05, 54 y.o.   MRN: [id number]  Patient is brought back today for fenestration of a left Baker's cyst  MRI shows meniscus injury and some degree of arthritis Ultrasound has shown a large Baker cyst  She has stiffness in the morning both above the knee and in the popliteal space Evenings she has more swelling in the popliteal space  Exam No acute distress  Left knee show some chronic degenerative changes She has full extension Flexion becomes painful about 120 and can get about 135 total   Ultrasound Confirms large Baker cyst as before with length today being about 3 cm

## 2014-11-14 NOTE — Patient Instructions (Signed)
Use the compression for 24 hours Prop up leg over the next hour and several times during the day Tomorrow good bandaid over back of knee area and compression sleeve Wear that for 2 to 5 days except at night  You had cortisone which had potential to work for 6 to 12 weeks  First 24 hours sometimes some pain from the pressure we added  Be sure no sign of infection - rednes, heat , fever If so call me - [phone] cell  After 3 days resume normal activity But first 3 days only what you need to do  Repeat scan in 1 month to see if much difference

## 2014-11-14 NOTE — Assessment & Plan Note (Signed)
Procedure note  The popliteal space was cleansed with chlorhexidine and alcohol The Baker cyst was identified and is 3 cm in length and 1-1/2 in width left knee  Four specific skin wheals were placed around the Baker cyst with 1% lidocaine  Under ultrasound guidance there is fenestration of the membrane of the Baker cyst in 4 locations Aspiration was unsuccessful as the material seemed viscous  After completion of fenestration we injected the cavity of the cyst with 4 cc of lidocaine and 1 mL of Solu-Medrol 40  This was done under direct visualization  At completion of procedure a pressure wrap was placed on the popliteal space  She was given appropriate precautions and we will reassess this in one month

## 2014-11-29 ENCOUNTER — Ambulatory Visit: Payer: BLUE CROSS/BLUE SHIELD | Admitting: Sports Medicine

## 2014-12-13 ENCOUNTER — Ambulatory Visit (INDEPENDENT_AMBULATORY_CARE_PROVIDER_SITE_OTHER): Payer: BLUE CROSS/BLUE SHIELD | Admitting: Sports Medicine

## 2014-12-13 ENCOUNTER — Encounter: Payer: Self-pay | Admitting: Sports Medicine

## 2014-12-13 VITALS — BP 117/66 | HR 83 | Ht 62.0 in | Wt 125.0 lb

## 2014-12-13 DIAGNOSIS — M7122 Synovial cyst of popliteal space [Baker], left knee: Secondary | ICD-10-CM

## 2014-12-13 DIAGNOSIS — M23304 Other meniscus derangements, unspecified medial meniscus, left knee: Secondary | ICD-10-CM

## 2014-12-13 DIAGNOSIS — M23307 Other meniscus derangements, unspecified meniscus, left knee: Secondary | ICD-10-CM

## 2014-12-13 DIAGNOSIS — M23301 Other meniscus derangements, unspecified lateral meniscus, left knee: Secondary | ICD-10-CM

## 2014-12-13 NOTE — Assessment & Plan Note (Signed)
Decrease in size with fenestration procedure approx 1 mo ago Did get relief with steroid injection for about 1 week Will attempt fenestration and steroid injection again in 2-3 mo Does not want surgery at this time

## 2014-12-13 NOTE — Progress Notes (Signed)
   Subjective:    Patient ID: Paige AbrahamNancy A Cammarano, female    DOB: 09-24-1961, 54 y.o.   MRN: [id number]  HPI Mrs. Aldean AstKimbrough is a 54 year old female who returns to sports medicine clinic for follow-up to left knee pain. 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 Initial injury occurred while the patient was playing tennis. She has obtained an MRI of the left knee which showed medial meniscus degenerative changes and a Baker's cyst.  We have attempted to aspirate the cyst without much success.  She did have an injection of solumedrol into the cyst.  She is currently doing stationary biking, wearing a compression sleeve while biking, and taking daily mobic.  She states that her knee continues to bother her, worst in the morning when she wakes.  She does feel that the motion is better than before injection and she has actually felt enough pain relief to play some tennis.  For 1 week post injection most pain was gone.  Past medical history, social history, medications, and allergies were reviewed and are up to date in the chart.  Review of Systems 7 point review of systems was performed and was otherwise negative unless noted in the history of present illness.     Objective:   Physical Exam BP 117/66 mmHg  Pulse 83  Ht 5\' 2"  (1.575 m)  Wt 125 lb (56.7 kg)  BMI 22.86 kg/m2 GEN: The patient is well-developed well-nourished female and in no acute distress.  She is awake alert and oriented x3. SKIN: warm and well-perfused, no rash  EXTR: No lower extremity edema or calf tenderness Neuro: Strength 5/5 globally. Sensation intact throughout. No focal deficits. Vasc: +2 bilateral distal pulses. No edema.  MSK: Examination of the left knee reveals suprapatellar effusion and small Baker's cyst. Range of motion is from 0-135 with mild pain at the endpoint of motion. She has tenderness to palpation over the medial joint line. No tenderness along the IT band. Negative patellar grind  test. No laxity with valgus or varus testing.  MRI Left Knee 09/30/14 IMPRESSION: 1. Radial tear involving the posterior horn of the medial meniscus at the meniscal root with associated medial protrusion of the meniscus and intrasubstance degenerative changes. 2. Intact ligamentous structures and no acute bony findings. 3. MCL and pes anserine bursitis. 4. Moderate-sized joint effusion with moderate synovitis. There is also a moderate-sized partially leaking Baker's cyst.  US: L knee: Persistent effusion in suprapatellar pouch.  Baker's cyst smaller in size than previous images with obvious septations.      Assessment & Plan:   Problem List Items Addressed This Visit    Degeneration of meniscus of knee    Not interested in surgical intervention at this time Continue with knee compression when playing tennis Continue mobic and conservative treatments See plan for Baker's cyst        Baker's cyst of knee - Primary    Decrease in size with fenestration procedure approx 1 mo ago Did get relief with steroid injection for about 1 week Will attempt fenestration and steroid injection again in 2-3 mo Does not want surgery at this time

## 2014-12-13 NOTE — Assessment & Plan Note (Signed)
Not interested in surgical intervention at this time Continue with knee compression when playing tennis Continue mobic and conservative treatments See plan for Baker's cyst

## 2014-12-20 ENCOUNTER — Other Ambulatory Visit: Payer: Self-pay | Admitting: Family Medicine

## 2014-12-21 ENCOUNTER — Other Ambulatory Visit: Payer: Self-pay | Admitting: *Deleted

## 2014-12-21 DIAGNOSIS — M23307 Other meniscus derangements, unspecified meniscus, left knee: Secondary | ICD-10-CM

## 2014-12-21 MED ORDER — MELOXICAM 15 MG PO TABS: 15.0000 mg | ORAL_TABLET | Freq: Every day | ORAL | Status: DC

## 2015-02-15 ENCOUNTER — Encounter: Payer: Self-pay | Admitting: Sports Medicine

## 2015-02-15 ENCOUNTER — Ambulatory Visit (INDEPENDENT_AMBULATORY_CARE_PROVIDER_SITE_OTHER): Payer: BLUE CROSS/BLUE SHIELD | Admitting: Sports Medicine

## 2015-02-15 VITALS — BP 147/84 | HR 67 | Ht 62.0 in | Wt 135.0 lb

## 2015-02-15 DIAGNOSIS — M23307 Other meniscus derangements, unspecified meniscus, left knee: Secondary | ICD-10-CM | POA: Diagnosis not present

## 2015-02-15 DIAGNOSIS — M23301 Other meniscus derangements, unspecified lateral meniscus, left knee: Secondary | ICD-10-CM | POA: Diagnosis not present

## 2015-02-15 DIAGNOSIS — M23304 Other meniscus derangements, unspecified medial meniscus, left knee: Secondary | ICD-10-CM

## 2015-02-15 DIAGNOSIS — M7122 Synovial cyst of popliteal space [Baker], left knee: Secondary | ICD-10-CM

## 2015-02-15 MED ORDER — METHYLPREDNISOLONE ACETATE 40 MG/ML IJ SUSP
40.0000 mg | Freq: Once | INTRAMUSCULAR | Status: AC
  Administered 2015-02-15: 40 mg via INTRA_ARTICULAR

## 2015-02-15 NOTE — Progress Notes (Signed)
Paige Woodard - 54 y.o. female MRN [id number]  Date of birth: [birth year]  SUBJECTIVE: CC: 1.  left knee pain, follow-up      HPI:   persistent swelling and anterior knee pain that is different than her last visit.  Posterior pain and swelling is improved.  Was having clicking and popping prior to developing large effusion over the last 3 days.  No giving way.  Taking meloxicam and occasional additional 800 mg ibuprofen.      ROS:   denies fevers, chills no significant left lower extremity swelling. Marland Kitchen.    HISTORY:  Past Medical, Surgical, Social, and Family History reviewed & updated per EMR.  Pertinent Historical Findings include: Social History   Occupational History  . Not on file.   Social History Main Topics  . Smoking status: Never Smoker   . Smokeless tobacco: Never Used  . Alcohol Use: 6.0 oz/week    10 Glasses of wine per week     Comment: occ  . Drug Use: No  . Sexual Activity: Not on file    No specialty comments available. Problem  Baker's Cyst of Knee   large Baker cyst of her left knee that has become symptomatic Posterior knee pain has significantly improved. Baker's cyst that is partially compressed from last aspiration and injection on 12/13/2014     Degeneration of Meniscus of Knee   Now with mechanical sxs and US findings consistent with deg meniscus injury MRI January 2016: Radial tear of posterior horn of medial meniscus at the meniscal root. Medial protrusion of the meniscus.    OBJECTIVE:  VS:   HT:5\' 2"  (157.5 cm)   WT:135 lb (61.236 kg)  BMI:24.7          BP:(!) 147/84 mmHg  HR:67bpm  TEMP: ( )  RESP:   PHYSICAL EXAM: GENERAL:  adult Caucasian female. No acute distress  PSYCH: Alert and appropriately interactive.  SKIN: No open skin lesions or abnormal skin markings on areas inspected as below  VASCULAR:  no significant pretibial edema.   NEURO:  LE sensation intact to light touch   Left Knee Exam: Appearance: normal  alignment, visible Baker's cyst   Skin: No overlying erythema/ecchymosis.  Palpation:  large effusion Patellar Grind: Negative Medial Joint Line: Mildly tender Lateral Joint Line: Nontender  Strength, ROM & OtherTests: Varus/Valgus Strain: Normal Anterior/Posterior Drawer: Normal Meniscal Testing: Tender but no significant mechanical symptoms with McMurray's Strength: Extensor mechanism intact ROM: 10 - 115     ASSESSMENT & PLAN: See problem based charting & AVS for additional documentation Problem List Items Addressed This Visit    Degeneration of meniscus of knee    PROCEDURE NOTE : Ultrasound Guided left knee Injection The risks, benefits and expected outcomes of the injection were reviewed and she wishes to undergo the above named procedure.  Written consent was obtained. After an appropriate time out was taken, the ultrasound was used to identify the target structure and adjacent vascular structures. The left knee was then prepped in a sterile fashion and subsequently injected as below: Prep:  Alcohol, sterile US gel and tegaderm  Approach:  superior lateral   Anesthes  Ethel chloride; 3 mL 1% lidocaine   Needle:  18-gauge 1.5 inch.   Aspirate:  38 mL of straw-colored fluid   Meds:  1 mL 40 mg Depo-Medrol, 3 mL 1% lidocaine   Images:  obtained, large effusion prior to aspiration   Dressing:  bandaid  This procedure was well tolerated and  there were no complications.     Discussed continued conservative management at this time. she wishes to avoid surgery at this time. Continue compression. Avoid aggressive exercise for 1 week. Start cycling again in 2-3 days. Ice when necessary. Discussed using meloxicam & can try to wean down if she is having improved symptoms.  Try to cut back on additional ibuprofen as cautioned as is likely only contribute potential side effects and GI distress.      Baker's cyst of knee - Primary    Likely reactive from intra-articular etiology of  medial meniscal tear. Appears partially decompressed today.      Relevant Medications   methylPREDNISolone acetate (DEPO-MEDROL) injection 40 mg (Completed)     FOLLOW UP:  Return in about 2 months (around 04/17/2015).

## 2015-02-15 NOTE — Assessment & Plan Note (Signed)
Likely reactive from intra-articular etiology of medial meniscal tear. Appears partially decompressed today.

## 2015-02-15 NOTE — Assessment & Plan Note (Addendum)
PROCEDURE NOTE : Ultrasound Guided left knee Injection The risks, benefits and expected outcomes of the injection were reviewed and she wishes to undergo the above named procedure.  Written consent was obtained. After an appropriate time out was taken, the ultrasound was used to identify the target structure and adjacent vascular structures. The left knee was then prepped in a sterile fashion and subsequently injected as below: Prep:  Alcohol, sterile US gel and tegaderm  Approach:  superior lateral   Anesthes  Ethel chloride; 3 mL 1% lidocaine   Needle:  18-gauge 1.5 inch.   Aspirate:  38 mL of straw-colored fluid   Meds:  1 mL 40 mg Depo-Medrol, 3 mL 1% lidocaine   Images:  obtained, large effusion prior to aspiration   Dressing:  bandaid  This procedure was well tolerated and there were no complications.     Discussed continued conservative management at this time. she wishes to avoid surgery at this time. Continue compression. Avoid aggressive exercise for 1 week. Start cycling again in 2-3 days. Ice when necessary. Discussed using meloxicam & can try to wean down if she is having improved symptoms.  Try to cut back on additional ibuprofen as cautioned as is likely only contribute potential side effects and GI distress.

## 2015-03-21 ENCOUNTER — Ambulatory Visit (INDEPENDENT_AMBULATORY_CARE_PROVIDER_SITE_OTHER): Payer: BLUE CROSS/BLUE SHIELD | Admitting: Sports Medicine

## 2015-03-21 ENCOUNTER — Encounter: Payer: Self-pay | Admitting: Sports Medicine

## 2015-03-21 VITALS — BP 147/85 | Ht 62.0 in | Wt 135.0 lb

## 2015-03-21 DIAGNOSIS — M23307 Other meniscus derangements, unspecified meniscus, left knee: Secondary | ICD-10-CM

## 2015-03-21 DIAGNOSIS — M23301 Other meniscus derangements, unspecified lateral meniscus, left knee: Secondary | ICD-10-CM | POA: Diagnosis not present

## 2015-03-21 DIAGNOSIS — M25562 Pain in left knee: Secondary | ICD-10-CM

## 2015-03-21 DIAGNOSIS — M23304 Other meniscus derangements, unspecified medial meniscus, left knee: Secondary | ICD-10-CM

## 2015-03-21 DIAGNOSIS — M7122 Synovial cyst of popliteal space [Baker], left knee: Secondary | ICD-10-CM | POA: Diagnosis not present

## 2015-03-21 NOTE — Assessment & Plan Note (Signed)
-  Mild on [place] today. Improved after fenestration. -Monitor

## 2015-03-21 NOTE — Patient Instructions (Addendum)
[facility] Dr. Jodi Geralds Saturday July 2nd at 915am Arrival time is 9am [address] [place], [place] [address] [phone]  Dr Jerl Santos is out of the office this week and his schedule is booking into Mid-July. Feel free to call the office and change the appt if you dont care to see Dr. Luiz Blare.

## 2015-03-21 NOTE — Assessment & Plan Note (Signed)
Very symptomatic with recurrent moderate to large re-accumulation of effusions. Aspirated and CST inj x1 into suprapatellar pouch, also then same for baker's cyst. -Patient wishes to remain active with tennis, but has been severely limited due to this.  -She wishes to discuss what surgery might entail, which we believe will potentially benefit her. She is nervous about the downtime from having surgery on her tennis, but her tennis is limited due to her pain. -We will have her meet with Orthopedic Surgery to discuss operative intervention options. -Follow-up if needed

## 2015-03-21 NOTE — Progress Notes (Signed)
Subjective:    Patient ID: Paige Woodard, female    DOB: 30-May-1961, 54 y.o.   MRN: [id number]  HPI Paige Woodard is a 54 year old female who presentsfor follow-up of left knee pain. Patient has a history of degenerative meniscal tear on the left. Initial injury occurred while the patient was playing tennis last winter. She was initially improving with use of a compression sleeve and meloxicam. Unfortunately, she noticed suddenly worsening knee catching and swelling.  Initially, she was found to have a moderate size Baker's cyst, which was aspirated and fenestrated with subsequent injection. This has improved. However, the then had recurrent anterior knee swelling. Last month she had 38  Cc's of yellow straw colored joint fluid aspirated from a large knee joint effusion. This was subsequently injected with corticosteroid.   The injection helped for approximately 2 weeks to lessen her pain.  However after 2 weeks it was almost as if she had not had the injection.  She continues to try to remain active by playing tennis.  She is wearing the compression sleeve.  She is doing therapeutic exercises.  She wishes to remain very active and her ongoing knee swelling is frustrating for her.  She notices intermittent sharp pain located along the medial joint line. Previous x-rays did not show severe medial joint line narrowing, rather mild to moderate joint space narrowing. Obviously, she is understandably frustrated with her on grinding knee pain and recurrent swelling.  She inquires regarding another aspiration today.  It is only 1 month since her last corticosteroid injection. She would  consider arthroscopic knee surgery for the left knee, but has some concerns due to the amount of time she thinks she would be down from playing tennis. She wishes to discuss what a surgery might entail to debride her torn meniscus.  Past medical history, social history, medications, and allergies were reviewed and are up to  date in the chart.  Review of Systems 7 point review of systems was performed and was otherwise negative unless noted in the history of present illness.     Objective:   Physical Exam BP 147/85 mmHg  Ht  (1.575 m)  Wt 135 lb (61.236 kg)  BMI 24.69 kg/m2 GEN: The patient is well-developed well-nourished female and in no acute distress.  She is awake alert and oriented x3. SKIN: warm and well-perfused, no rash  Neuro: Strength 5/5 globally. Sensation intact throughout. No focal deficits. Vasc: +2 bilateral distal pulses. MSK: Examination of the left knee reveals range of motion from 3-110 with mild pain at the implant flexion.  +1 effusion is noted with patellar ballottement.  No significant posterior swelling.  Medial joint line tenderness.  Positive patellar grind test.  The quadriceps and patellar tendons are grossly intact.  Positive McMurray's testing. No valgus or varus laxity.  Negative anterior drawer.  Limited musculoskeletal ultrasound: Long and short extensors are obtained of left knee today.  There is a large medial Pearman meniscal cyst with medial meniscal protrusion from the joint space.  The joint space appears to be narrowed.  There is a large suprapatellar pouch effusion extending approximately 5 cm.  The quadriceps and patellar tendons are grossly intact.  She has a small Baker's cyst located posteriorly, which is improved compared to previous exams.  Left Knee MRI 09/30/14: IMPRESSION: 1. Radial tear involving the posterior horn of the medial meniscus at the meniscal root with associated medial protrusion of the meniscus and intrasubstance degenerative changes. 2. Intact ligamentous structures  and no acute bony findings. 3. MCL and pes anserine bursitis. 4. Moderate-sized joint effusion with moderate synovitis. There is also a moderate-sized partially leaking Baker's cyst.     Assessment & Plan:  Please see problem based assessment and plan in the problem  list.

## 2015-04-03 ENCOUNTER — Other Ambulatory Visit: Payer: Self-pay | Admitting: *Deleted

## 2015-04-03 DIAGNOSIS — M23307 Other meniscus derangements, unspecified meniscus, left knee: Secondary | ICD-10-CM

## 2015-04-03 MED ORDER — MELOXICAM 15 MG PO TABS: 15.0000 mg | ORAL_TABLET | Freq: Every day | ORAL | Status: DC

## 2015-05-24 ENCOUNTER — Encounter: Payer: Self-pay | Admitting: Sports Medicine

## 2015-05-24 ENCOUNTER — Ambulatory Visit (INDEPENDENT_AMBULATORY_CARE_PROVIDER_SITE_OTHER): Payer: BLUE CROSS/BLUE SHIELD | Admitting: Sports Medicine

## 2015-05-24 VITALS — BP 129/65 | Ht 62.0 in | Wt 135.0 lb

## 2015-05-24 DIAGNOSIS — M23304 Other meniscus derangements, unspecified medial meniscus, left knee: Secondary | ICD-10-CM

## 2015-05-24 DIAGNOSIS — M23307 Other meniscus derangements, unspecified meniscus, left knee: Secondary | ICD-10-CM

## 2015-05-24 DIAGNOSIS — M23301 Other meniscus derangements, unspecified lateral meniscus, left knee: Secondary | ICD-10-CM | POA: Diagnosis not present

## 2015-05-24 MED ORDER — METHYLPREDNISOLONE ACETATE 40 MG/ML IJ SUSP
40.0000 mg | Freq: Once | INTRAMUSCULAR | Status: AC
  Administered 2015-05-24: 40 mg via INTRA_ARTICULAR

## 2015-05-24 MED ORDER — MELOXICAM 15 MG PO TABS: 15.0000 mg | ORAL_TABLET | Freq: Every day | ORAL | Status: DC

## 2015-05-24 NOTE — Assessment & Plan Note (Signed)
Given CSI today to help lessen sxs of medial meniscus injury  She will have surgery in winter  Cont other conservative measures/ biking for rehab

## 2015-05-24 NOTE — Progress Notes (Signed)
Patient ID: Paige Woodard, female   DOB: 08-Aug-1961, 54 y.o.   MRN: [id number]  CC: Follow up left knee  HPI:  Paige Woodard is a 54 year old female who presentsfor follow-up of left knee pain.  Patient has a history of degenerative meniscal tear on the left that initially occurred while playing tennis last winter.  Since then she has received two corticosteroid injections of the left knee (last 03/21/15), which usually help for a few weeks after.  She has seen orthopedics recently who told her 25% of her meniscus was torn and they would recommend surgery She is considering this after the first of the year She continues to try to remain active by playing tennis. She is wearing the compression sleeve. Today, she states that she notices sharp pain located along the medial joint line that is present any time she is walking or active; standing and laying are the most comfortable positions for her.  She feels that her knee swelling is improved relative to previous; "this is a good day."  She also has a moderate size Baker's cyst, which she feels has improved. She inquires regarding another aspiration today.  BP 129/65 mmHg  Ht  (1.575 m)  Wt 135 lb (61.236 kg)  BMI 24.69 kg/m2 GEN: The patient is well-developed well-nourished female and in no acute distress. She is awake alert and oriented x3. SKIN: warm and well-perfused, no rash  Neuro: Strength 5/5 globally. Sensation intact throughout. No focal deficits. Vasc: +2 bilateral distal pulses. MSK: Examination of the left knee reveals mild swelling in relation to the right knee.No significant posterior swelling. Medial joint line tenderness.She is not able to fully extend the knee.   Positive McMurray's testing. No valgus or varus laxity. Negative anterior drawer.  Limited musculoskeletal ultrasound: Long and short [place] views are obtained of left knee today. There is  medial meniscal protrusion from the joint space. The joint space  appears to be narrowed. There is a large suprapatellar pouch effusion. The quadriceps and patellar tendons are intact.   Assessment:  Paige Woodard is a 54 year old female who presents for follow-up of left knee pain. She notes that she has seen the orthopedic surgeons and is considering surgical intervention after the first of next year. Her symptoms have largely remained the same, with daily pain with walking or standing from sitting or laying for prolonged periods of time. She would like injection today to help with her symptoms. Ultrasound showed large suprapatellar pouch of fluid.   Plan:  She was consented for the procedure, with explanation of risks and benefits. The area was cleaned with betadine solution and alcohol pads, and then ultrasound was used to inject 40 mg solumedrol and 4 cc of Lidocaine 1 % laterally into the suprapatellar collection. She tolerated the procedure well.  She was asked to only do light activity for the next three days.  She was told that she could use anti inflammatory medication PRN for pain.  She was urged to use ice, heat, and compression to help manage symptoms.

## 2015-11-02 IMAGING — CR DG KNEE AP/LAT W/ SUNRISE*L*
1 series · 1 of 1 positions shown · non-contrast
Comparison: None.

CLINICAL DATA: 53-year-old female with chronic left knee pain.
Degeneration of meniscus. Initial encounter.

EXAM:
DG KNEE - 3 VIEWS

[view not recorded]
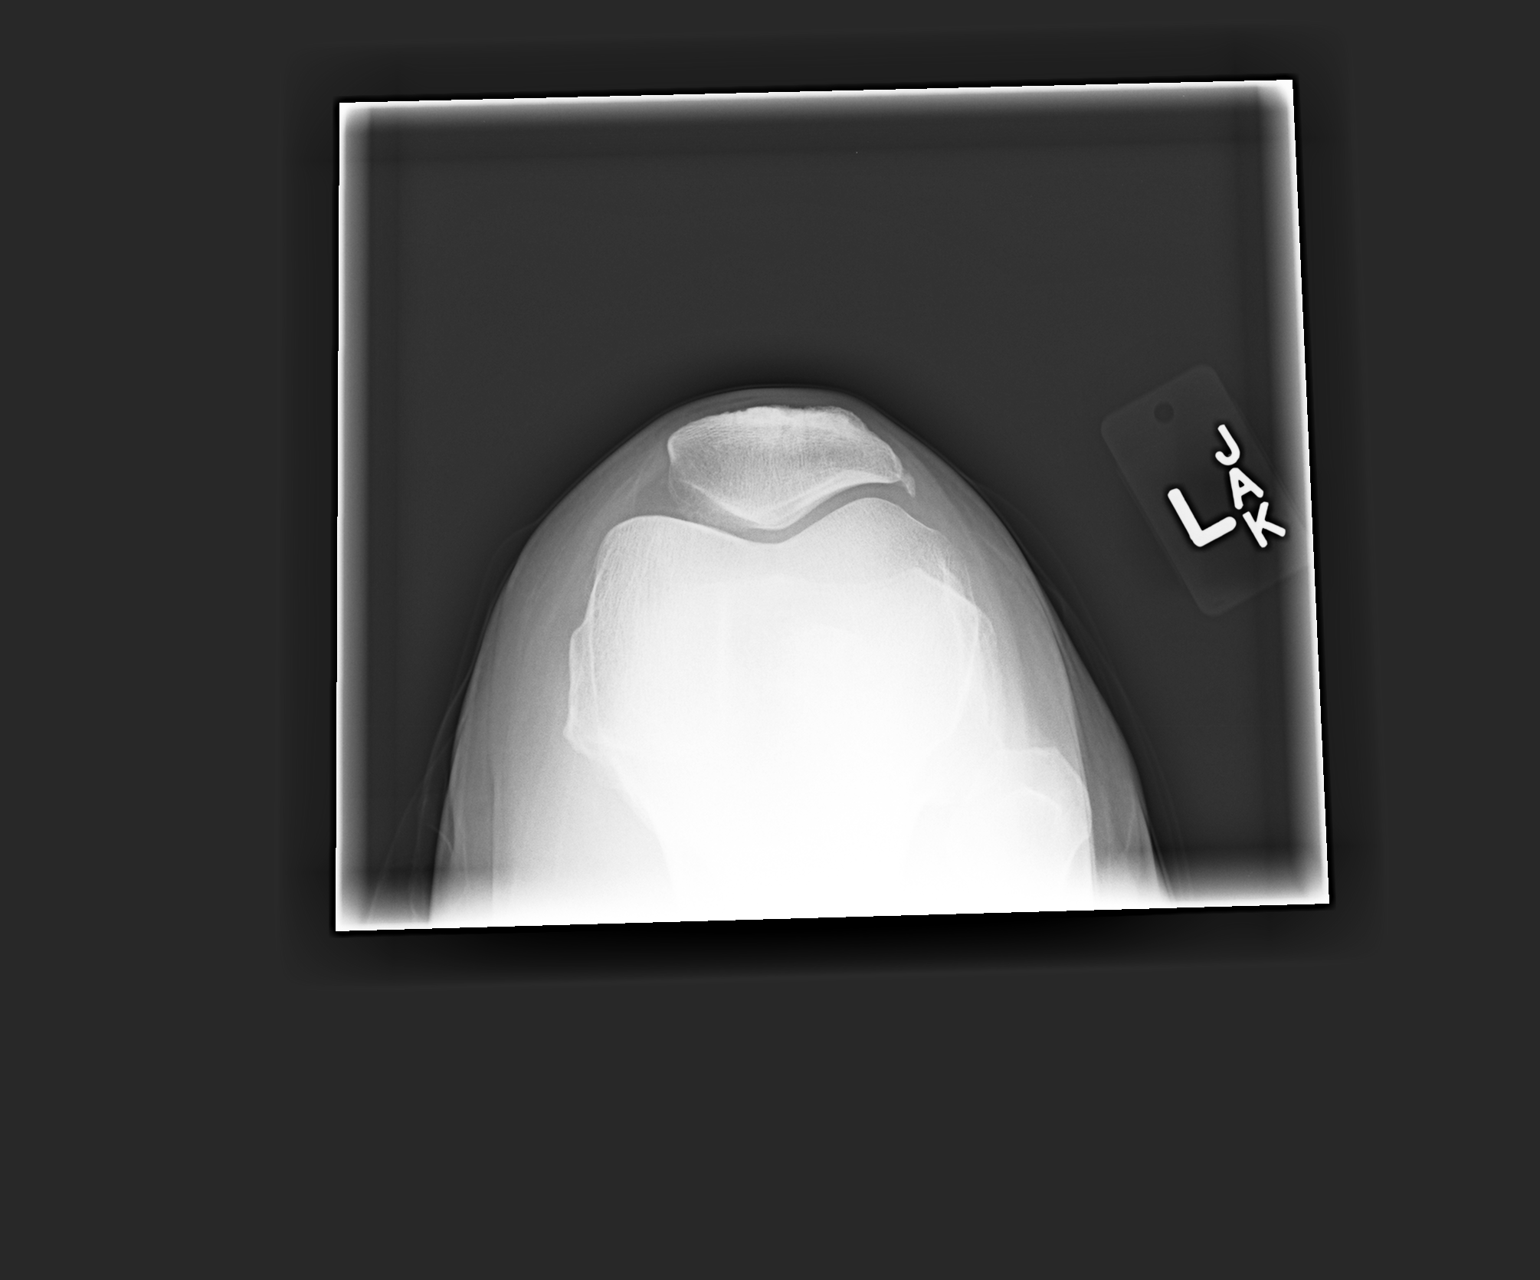

[1 of 1 positions shown; findings below may reference images not displayed]

FINDINGS: Possible small joint effusion. Mild tricompartmental joint space
loss. Mild degenerative spurring of the patella. The patella is
intact. Bone mineralization is within normal limits. No fracture or
dislocation identified.
IMPRESSION: Mild tricompartmental joint space loss at the left knee. Possible
trace joint effusion.

## 2016-01-17 DIAGNOSIS — M1712 Unilateral primary osteoarthritis, left knee: Secondary | ICD-10-CM | POA: Diagnosis not present

## 2016-02-07 DIAGNOSIS — Z1211 Encounter for screening for malignant neoplasm of colon: Secondary | ICD-10-CM | POA: Diagnosis not present

## 2016-02-07 DIAGNOSIS — M25562 Pain in left knee: Secondary | ICD-10-CM | POA: Diagnosis not present

## 2016-02-07 DIAGNOSIS — Z01818 Encounter for other preprocedural examination: Secondary | ICD-10-CM | POA: Diagnosis not present

## 2016-02-07 LAB — CBC AND DIFFERENTIAL: WBC: 3.5

## 2016-02-14 NOTE — H&P (Signed)
TOTAL KNEE ADMISSION H&P  Patient is being admitted for left total knee arthroplasty.  Subjective:  Chief Complaint:left knee pain.  HPI: Paige Woodard, 55 y.o. female, has a history of pain and functional disability in the left knee due to trauma and arthritis and has failed non-surgical conservative treatments for greater than 12 weeks to includeNSAID's and/or analgesics, corticosteriod injections, flexibility and strengthening excercises, supervised PT with diminished ADL's post treatment and activity modification.  Onset of symptoms was gradual, starting 1 year ago with gradually worsening course since that time. The patient noted prior procedures on the knee to include  arthroscopy and menisectomy on the left knee(s).  Patient currently rates pain in the left knee(s) at 8 out of 10 with activity. Patient has night pain, worsening of pain with activity and weight bearing, pain that interferes with activities of daily living, pain with passive range of motion, crepitus and joint swelling.  Patient has evidence of periarticular osteophytes and joint space narrowing by imaging studies.  There is no active infection.  Patient Active Problem List   Diagnosis Date Noted  . Baker's cyst of knee 11/14/2014  . 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 Low back pain of over 3 months duration 08/01/2014  . Carpal tunnel syndrome on both sides 10/14/2012  . Arthritis of right hand 09/07/2012   Past Medical History  Diagnosis Date  . No pertinent past medical history   . Neuromuscular disorder     carpal tunnel / Bil    Past Surgical History  Procedure Laterality Date  . Dilation and curettage of uterus    . Tonsillectomy    . Carpal tunnel release  11/05/2011    Procedure: CARPAL TUNNEL RELEASE;  Surgeon: Harvie Junior, MD;  Location: [facility];  Service: Orthopedics;  Laterality: Right;      Current outpatient prescriptions:  .  cloNIDine (CATAPRES) 0.1 MG tablet,  Take 0.1 mg by mouth at bedtime., Disp: , Rfl: 3 .  ibuprofen (ADVIL,MOTRIN) 200 MG tablet, Take 800 mg by mouth every 6 (six) hours as needed (For pain.). , Disp: , Rfl:  .  tetrahydrozoline 0.05 % ophthalmic solution, Place 1 drop into both eyes 4 (four) times daily as needed (For eye irritation.)., Disp: , Rfl:   Allergies  Allergen Reactions  . Bee Pollen Anaphylaxis  . Codeine Anaphylaxis  . Poison Ivy Extract [employer] Of Poison Ivy] Other (See Comments)    Acute reaction  . Hydrocodone Itching  . Oxycodone Itching  . Tramadol Other (See Comments)    Reaction unknown    Social History  Substance Use Topics  . Smoking status: Never Smoker   . Smokeless tobacco: Never Used  . Alcohol Use: 6.0 oz/week    10 Glasses of wine per week     Comment: occ      Review of Systems  Constitutional: Negative.   HENT: Negative.   Eyes: Negative.   Respiratory: Negative.   Cardiovascular: Negative.   Gastrointestinal: Negative.   Genitourinary: Negative.   Musculoskeletal: Positive for joint pain. Negative for myalgias, back pain, falls and neck pain.  Skin: Negative.   Neurological: Negative.   Endo/Heme/Allergies: Negative.   Psychiatric/Behavioral: Negative.     Objective:  Physical Exam  Constitutional: She is oriented to person, place, and time. She appears well-developed and well-nourished. No distress.  HENT:  Head: Normocephalic and atraumatic.  Right Ear: External ear normal.  Left Ear: External ear normal.  Nose: Nose normal.  Mouth/Throat: Oropharynx is clear and moist.  Eyes: Conjunctivae and EOM are normal.  Neck: Normal range of motion. Neck supple.  Cardiovascular: Normal rate, regular rhythm, normal heart sounds and intact distal pulses.   No murmur heard. Respiratory: Effort normal and breath sounds normal. No respiratory distress. She has no wheezes.  GI: Soft. Bowel sounds are normal. She exhibits no distension. There is no tenderness.  Musculoskeletal:   Both hips show normal range of motion with no discomfort. Her right knee shows no effusion, range 0 to 135. No tenderness or instability. Left knee, trace effusion. Range 5 to 130. Marked crepitus on range of motion, tenderness, medial greater than lateral with no instability noted.   Neurological: She is alert and oriented to person, place, and time. She has normal strength and normal reflexes. No sensory deficit.  Skin: No rash noted. She is not diaphoretic. No erythema.  Psychiatric: She has a normal mood and affect. Her behavior is normal.    Vitals  Weight: 135 lb Height: 62in Body Surface Area: 1.62 m Body Mass Index: 24.69 kg/m  Pulse: 76 (Regular)  BP: 128/70 (Sitting, Left Arm, Standard)  Imaging Review Plain radiographs demonstrate severe degenerative joint disease of the left knee(s). The overall alignment ismild varus. The bone quality appears to be good for age and reported activity level.  Assessment/Plan:  End stage primary osteoarthritis, left knee   The patient history, physical examination, clinical judgment of the provider and imaging studies are consistent with end stage degenerative joint disease of the left knee(s) and total knee arthroplasty is deemed medically necessary. The treatment options including medical management, injection therapy arthroscopy and arthroplasty were discussed at length. The risks and benefits of total knee arthroplasty were presented and reviewed. The risks due to aseptic loosening, infection, stiffness, patella tracking problems, thromboembolic complications and other imponderables were discussed. The patient acknowledged the explanation, agreed to proceed with the plan and consent was signed. Patient is being admitted for inpatient treatment for surgery, pain control, PT, OT, prophylactic antibiotics, VTE prophylaxis, progressive ambulation and ADL's and discharge planning. The patient is planning to be discharged home with home health  services    PCP: Deboraha SprangEagle at [facility]  Patient reports that she believes that she has had dilaudid in the past with itching only, but she is not sure.  Dimitri PedAmber Felisia Balcom, PA-C

## 2016-02-21 ENCOUNTER — Ambulatory Visit: Payer: Self-pay | Admitting: Orthopedic Surgery

## 2016-02-21 NOTE — Progress Notes (Signed)
Preoperative surgical orders have been place into the Epic hospital system for [employer] [facility] on 02/21/2016, 1:13 PM  by Patrica DuelPERKINS, Sylvester Minton for surgery on 03-03-16.  Preop Total Knee orders including Experal, IV Tylenol, and IV Decadron as long as there are no contraindications to the above medications. Avel Peacerew Hatcher Froning, PA-C

## 2016-02-26 ENCOUNTER — Encounter (HOSPITAL_COMMUNITY): Payer: Self-pay

## 2016-02-26 ENCOUNTER — Encounter (HOSPITAL_COMMUNITY)
Admission: RE | Admit: 2016-02-26 | Discharge: 2016-02-26 | Disposition: A | Discharge status: Home / Self Care | Payer: BLUE CROSS/BLUE SHIELD | Source: Ambulatory Visit | Attending: Orthopedic Surgery | Admitting: Orthopedic Surgery

## 2016-02-26 DIAGNOSIS — Z01812 Encounter for preprocedural laboratory examination: Secondary | ICD-10-CM | POA: Insufficient documentation

## 2016-02-26 DIAGNOSIS — M1712 Unilateral primary osteoarthritis, left knee: Secondary | ICD-10-CM | POA: Insufficient documentation

## 2016-02-26 HISTORY — DX: Unspecified osteoarthritis, unspecified site: M19.90

## 2016-02-26 LAB — CBC
HCT: 43 % (ref 36.0–46.0)
Hemoglobin: 14.4 g/dL (ref 12.0–15.0)
MCH: 31.2 pg (ref 26.0–34.0)
MCHC: 33.5 g/dL (ref 30.0–36.0)
MCV: 93.3 fL (ref 78.0–100.0)
Platelets: 278 10*3/uL (ref 150–400)
RBC: 4.61 MIL/uL (ref 3.87–5.11)
RDW: 12.4 % (ref 11.5–15.5)
WBC: 4 10*3/uL (ref 4.0–10.5)

## 2016-02-26 LAB — ABO/RH: ABO/RH(D): B POS

## 2016-02-26 LAB — COMPREHENSIVE METABOLIC PANEL
ALT: 13 U/L — ABNORMAL LOW (ref 14–54)
AST: 20 U/L (ref 15–41)
Albumin: 4.8 g/dL (ref 3.5–5.0)
Alkaline Phosphatase: 54 U/L (ref 38–126)
Anion gap: 7 (ref 5–15)
BUN: 13 mg/dL (ref 6–20)
CO2: 26 mmol/L (ref 22–32)
Calcium: 9.6 mg/dL (ref 8.9–10.3)
Chloride: 107 mmol/L (ref 101–111)
Creatinine, Ser: 0.74 mg/dL (ref 0.44–1.00)
GFR calc Af Amer: >60 mL/min (ref >60)
GFR calc non Af Amer: >60 mL/min (ref >60)
Glucose, Bld: 97 mg/dL (ref 65–99)
Potassium: 4 mmol/L (ref 3.5–5.1)
Sodium: 140 mmol/L (ref 135–145)
Total Bilirubin: 0.7 mg/dL (ref 0.3–1.2)
Total Protein: 7.1 g/dL (ref 6.5–8.1)

## 2016-02-26 LAB — URINALYSIS, ROUTINE W REFLEX MICROSCOPIC
Bilirubin Urine: NEGATIVE
Glucose, UA: NEGATIVE mg/dL
Hgb urine dipstick: NEGATIVE
Ketones, ur: NEGATIVE mg/dL
Leukocytes, UA: NEGATIVE
Nitrite: NEGATIVE
Protein, ur: NEGATIVE mg/dL
Specific Gravity, Urine: 1.005 (ref 1.005–1.030)
pH: 5.5 (ref 5.0–8.0)

## 2016-02-26 LAB — APTT: aPTT: 26 seconds (ref 24–37)

## 2016-02-26 LAB — PROTIME-INR
INR: 0.92 (ref 0.00–1.49)
Prothrombin Time: 12.6 seconds (ref 11.6–15.2)

## 2016-02-26 LAB — SURGICAL PCR SCREEN
MRSA, PCR: NEGATIVE
Staphylococcus aureus: NEGATIVE

## 2016-02-26 NOTE — Patient Instructions (Addendum)
ELIE GRAGERT  02/26/2016   Your procedure is scheduled on: 03/03/16  Report to [facility] Main  Entrance take [facility]  elevators to 3rd floor to  Short Stay Center at 9:30 AM.  Call this number if you have problems the morning of surgery [phone]   Remember: ONLY 1 PERSON MAY GO WITH YOU TO SHORT STAY TO GET  READY MORNING OF YOUR SURGERY.  Do not eat food or drink liquids :After Midnight SUNDAY     Take these medicines the morning of surgery with A SIP OF WATER- none needed                               You may not have any metal on your body including hair pins and              piercings  Do not wear jewelry, make-up, lotions, powders or perfumes, deodorant             Do not wear nail polish.                 Do not bring valuables to the hospital. [facility] IS NOT             RESPONSIBLE   FOR VALUABLES.  Contacts, dentures or bridgework may not be worn into surgery.  Leave suitcase in the car. After surgery it may be brought to your room.        _____________________________________________________________________               Rogelia Mire  An incentive spirometer is a tool that can help keep your lungs clear and active. This tool measures how well you are filling your lungs with each breath. Taking long deep breaths may help reverse or decrease the chance of developing breathing (pulmonary) problems (especially infection) following:  A long period of time when you are unable to move or be active. BEFORE THE PROCEDURE   If the spirometer includes an indicator to show your best effort, your nurse or respiratory therapist will set it to a desired goal.  If possible, sit up straight or lean slightly forward. Try not to slouch.  Hold the incentive spirometer in an upright position. INSTRUCTIONS FOR USE  1. Sit on the edge of your bed if possible, or sit up as far as you can in bed or on a chair. 2. Hold the incentive spirometer in  an upright position. 3. Breathe out normally. 4. Place the mouthpiece in your mouth and seal your lips tightly around it. 5. Breathe in slowly and as deeply as possible, raising the piston or the ball toward the top of the column. 6. Hold your breath for 3-5 seconds or for as long as possible. Allow the piston or ball to fall to the bottom of the column. 7. Remove the mouthpiece from your mouth and breathe out normally. 8. Rest for a few seconds and repeat Steps 1 through 7 at least 10 times every 1-2 hours when you are awake. Take your time and take a few normal breaths between deep breaths. 9. The spirometer may include an indicator to show your best effort. Use the indicator as a goal to work toward during each repetition. 10. After each set of 10 deep breaths, practice coughing to be sure your lungs are clear. If you have  an incision (the cut made at the time of surgery), support your incision when coughing by placing a pillow or rolled up towels firmly against it. Once you are able to get out of bed, walk around indoors and cough well. You may stop using the incentive spirometer when instructed by your caregiver.  RISKS AND COMPLICATIONS  Take your time so you do not get dizzy or light-headed.  If you are in pain, you may need to take or ask for pain medication before doing incentive spirometry. It is harder to take a deep breath if you are having pain. AFTER USE  Rest and breathe slowly and easily.  It can be helpful to keep track of a log of your progress. Your caregiver can provide you with a simple table to help with this. If you are using the spirometer at home, follow these instructions: SEEK MEDICAL CARE IF:   You are having difficultly using the spirometer.  You have trouble using the spirometer as often as instructed.  Your pain medication is not giving enough relief while using the spirometer.  You develop fever of 100.5 F (38.1 C) or higher. SEEK IMMEDIATE MEDICAL CARE  IF:   You cough up bloody sputum that had not been present before.  You develop fever of 102 F (38.9 C) or greater.  You develop worsening pain at or near the incision site. MAKE SURE YOU:   Understand these instructions.  Will watch your condition.  Will get help right away if you are not doing well or get worse. Document Released: 01/26/2007 Document Revised: 12/08/2011 Document Reviewed: 03/29/2007 ExitCare Patient Information 2014 [place] [employer].   ________________________________________________________________________ [facility] - Preparing for Surgery  Before surgery, you can play an important role.  Because skin is not sterile, your skin needs to be as free of germs as possible.  You can reduce the number of germs on you skin by washing with CHG (chlorahexidine gluconate) soap before surgery.  CHG is an antiseptic cleaner which kills germs and bonds with the skin to continue killing germs even after washing.  Please DO NOT use if you have an allergy to CHG or antibacterial soaps.  If your skin becomes reddened/irritated stop using the CHG and inform your nurse when you arrive at Short Stay.  Do not shave (including legs and underarms) for at least 48 hours prior to the first CHG shower.  You may shave your face.  Please follow these instructions carefully:   1.  Shower with CHG Soap the night before surgery and the                                morning of Surgery.  2.  If you choose to wash your hair, wash your hair first as usual with your       normal shampoo.  3.  After you shampoo, rinse your hair and body thoroughly to remove the                      Shampoo.  4.  Use CHG as you would any other liquid soap.  You can apply chg directly       to the skin and wash gently with scrungie or a clean washcloth.  5.  Apply the CHG Soap to your body ONLY FROM THE NECK DOWN.        Do not use on open wounds or open  sores.  Avoid contact with your eyes,       ears, mouth and  genitals (private parts).  Wash genitals (private parts)       with your normal soap.  6.  Wash thoroughly, paying special attention to the area where your surgery        will be performed.  7.  Thoroughly rinse your body with warm water from the neck down.  8.  DO NOT shower/wash with your normal soap after using and rinsing off       the CHG Soap.  9.  Pat yourself dry with a clean towel.            10.  Wear clean pajamas.            11.  Place clean sheets on your bed the night of your first shower and do not        sleep with pets.  Day of Surgery  Do not apply any lotions/deoderants the morning of surgery.  Please wear clean clothes to the hospital/surgery center.

## 2016-03-03 ENCOUNTER — Inpatient Hospital Stay (HOSPITAL_COMMUNITY): Payer: BLUE CROSS/BLUE SHIELD | Admitting: Anesthesiology

## 2016-03-03 ENCOUNTER — Encounter (HOSPITAL_COMMUNITY)
Admission: RE | Disposition: A | Discharge status: Home / Self Care | Payer: Self-pay | Source: Ambulatory Visit | Attending: Orthopedic Surgery

## 2016-03-03 ENCOUNTER — Inpatient Hospital Stay (HOSPITAL_COMMUNITY)
Admission: RE | Admit: 2016-03-03 | Discharge: 2016-03-05 | DRG: 470 | Disposition: A | Discharge status: Home / Self Care | Payer: BLUE CROSS/BLUE SHIELD | Source: Ambulatory Visit | Attending: Orthopedic Surgery | Admitting: Orthopedic Surgery

## 2016-03-03 ENCOUNTER — Encounter (HOSPITAL_COMMUNITY): Payer: Self-pay | Admitting: *Deleted

## 2016-03-03 DIAGNOSIS — M179 Osteoarthritis of knee, unspecified: Secondary | ICD-10-CM | POA: Diagnosis present

## 2016-03-03 DIAGNOSIS — M1712 Unilateral primary osteoarthritis, left knee: Secondary | ICD-10-CM | POA: Diagnosis not present

## 2016-03-03 DIAGNOSIS — M1710 Unilateral primary osteoarthritis, unspecified knee: Secondary | ICD-10-CM | POA: Diagnosis present

## 2016-03-03 DIAGNOSIS — M25562 Pain in left knee: Secondary | ICD-10-CM | POA: Diagnosis not present

## 2016-03-03 HISTORY — PX: TOTAL KNEE ARTHROPLASTY: SHX125

## 2016-03-03 SURGERY — ARTHROPLASTY, KNEE, TOTAL
Anesthesia: Monitor Anesthesia Care | Site: Knee | Laterality: Left

## 2016-03-03 MED ORDER — METOCLOPRAMIDE HCL 5 MG/ML IJ SOLN: 5.0000 mg | Freq: Three times a day (TID) | INTRAMUSCULAR | Status: DC | PRN

## 2016-03-03 MED ORDER — FLEET ENEMA 7-19 GM/118ML RE ENEM: 1.0000 | ENEMA | Freq: Once | RECTAL | Status: DC | PRN

## 2016-03-03 MED ORDER — ACETAMINOPHEN 10 MG/ML IV SOLN
INTRAVENOUS | Status: AC
  Filled 2016-03-03: qty 100

## 2016-03-03 MED ORDER — BUPIVACAINE HCL (PF) 0.25 % IJ SOLN
INTRAMUSCULAR | Status: AC
  Filled 2016-03-03: qty 30

## 2016-03-03 MED ORDER — EPHEDRINE SULFATE 50 MG/ML IJ SOLN
INTRAMUSCULAR | Status: DC | PRN
  Administered 2016-03-03: 5 mg via INTRAVENOUS
  Administered 2016-03-03: 10 mg via INTRAVENOUS

## 2016-03-03 MED ORDER — SODIUM CHLORIDE 0.9 % IV SOLN
INTRAVENOUS | Status: DC
  Administered 2016-03-03 – 2016-03-04 (×2): via INTRAVENOUS

## 2016-03-03 MED ORDER — LACTATED RINGERS IV SOLN: INTRAVENOUS | Status: DC

## 2016-03-03 MED ORDER — ONDANSETRON HCL 4 MG/2ML IJ SOLN
INTRAMUSCULAR | Status: DC | PRN
  Administered 2016-03-03: 4 mg via INTRAVENOUS

## 2016-03-03 MED ORDER — BUPIVACAINE HCL 0.25 % IJ SOLN
INTRAMUSCULAR | Status: DC | PRN
  Administered 2016-03-03: 20 mL

## 2016-03-03 MED ORDER — CEFAZOLIN SODIUM-DEXTROSE 2-4 GM/100ML-% IV SOLN
2.0000 g | Freq: Four times a day (QID) | INTRAVENOUS | Status: AC
  Administered 2016-03-03 – 2016-03-04 (×2): 2 g via INTRAVENOUS
  Filled 2016-03-03 (×2): qty 100

## 2016-03-03 MED ORDER — ONDANSETRON HCL 4 MG PO TABS: 4.0000 mg | ORAL_TABLET | Freq: Four times a day (QID) | ORAL | Status: DC | PRN

## 2016-03-03 MED ORDER — PROPOFOL 10 MG/ML IV BOLUS
INTRAVENOUS | Status: DC | PRN
  Administered 2016-03-03: 40 mg via INTRAVENOUS
  Administered 2016-03-03: 20 mg via INTRAVENOUS

## 2016-03-03 MED ORDER — PROMETHAZINE HCL 25 MG/ML IJ SOLN: 6.2500 mg | INTRAMUSCULAR | Status: DC | PRN

## 2016-03-03 MED ORDER — ACETAMINOPHEN 10 MG/ML IV SOLN
1000.0000 mg | Freq: Once | INTRAVENOUS | Status: AC
  Administered 2016-03-03: 1000 mg via INTRAVENOUS

## 2016-03-03 MED ORDER — HYDROMORPHONE HCL 2 MG PO TABS
2.0000 mg | ORAL_TABLET | ORAL | Status: DC | PRN
  Administered 2016-03-03: 4 mg via ORAL
  Administered 2016-03-03: 2 mg via ORAL
  Administered 2016-03-04 – 2016-03-05 (×7): 4 mg via ORAL
  Filled 2016-03-03 (×10): qty 2
  Filled 2016-03-03: qty 1

## 2016-03-03 MED ORDER — LACTATED RINGERS IV SOLN
INTRAVENOUS | Status: DC
  Administered 2016-03-03: 1000 mL via INTRAVENOUS
  Administered 2016-03-03: 13:00:00 via INTRAVENOUS

## 2016-03-03 MED ORDER — PHENOL 1.4 % MT LIQD
1.0000 | OROMUCOSAL | Status: DC | PRN
  Filled 2016-03-03: qty 177

## 2016-03-03 MED ORDER — FENTANYL CITRATE (PF) 100 MCG/2ML IJ SOLN
INTRAMUSCULAR | Status: DC | PRN
  Administered 2016-03-03: 50 ug via INTRAVENOUS

## 2016-03-03 MED ORDER — DIPHENHYDRAMINE HCL 12.5 MG/5ML PO ELIX
12.5000 mg | ORAL_SOLUTION | ORAL | Status: DC | PRN
Start: 2016-03-03 — End: 2016-03-05

## 2016-03-03 MED ORDER — METHOCARBAMOL 500 MG PO TABS
500.0000 mg | ORAL_TABLET | Freq: Four times a day (QID) | ORAL | Status: DC | PRN
Start: 2016-03-03 — End: 2016-03-05
  Administered 2016-03-04 – 2016-03-05 (×5): 500 mg via ORAL
  Filled 2016-03-03 (×5): qty 1

## 2016-03-03 MED ORDER — LIDOCAINE HCL (CARDIAC) 20 MG/ML IV SOLN
INTRAVENOUS | Status: DC | PRN
  Administered 2016-03-03: 50 mg via INTRAVENOUS

## 2016-03-03 MED ORDER — TRANEXAMIC ACID 1000 MG/10ML IV SOLN
1000.0000 mg | INTRAVENOUS | Status: AC
  Administered 2016-03-03: 1000 mg via INTRAVENOUS
  Filled 2016-03-03: qty 10

## 2016-03-03 MED ORDER — SODIUM CHLORIDE 0.9 % IJ SOLN
INTRAMUSCULAR | Status: DC | PRN
  Administered 2016-03-03: 30 mL

## 2016-03-03 MED ORDER — METOCLOPRAMIDE HCL 10 MG PO TABS: 5.0000 mg | ORAL_TABLET | Freq: Three times a day (TID) | ORAL | Status: DC | PRN

## 2016-03-03 MED ORDER — CHLORHEXIDINE GLUCONATE 4 % EX LIQD: 60.0000 mL | Freq: Once | CUTANEOUS | Status: DC

## 2016-03-03 MED ORDER — BUPIVACAINE LIPOSOME 1.3 % IJ SUSP
20.0000 mL | Freq: Once | INTRAMUSCULAR | Status: DC
  Filled 2016-03-03: qty 20

## 2016-03-03 MED ORDER — POLYETHYLENE GLYCOL 3350 17 G PO PACK: 17.0000 g | PACK | Freq: Every day | ORAL | Status: DC | PRN

## 2016-03-03 MED ORDER — ONDANSETRON HCL 4 MG/2ML IJ SOLN: 4.0000 mg | Freq: Four times a day (QID) | INTRAMUSCULAR | Status: DC | PRN

## 2016-03-03 MED ORDER — PROPOFOL 10 MG/ML IV BOLUS
INTRAVENOUS | Status: AC
  Filled 2016-03-03: qty 40

## 2016-03-03 MED ORDER — RIVAROXABAN 10 MG PO TABS
10.0000 mg | ORAL_TABLET | Freq: Every day | ORAL | Status: DC
  Administered 2016-03-04 – 2016-03-05 (×2): 10 mg via ORAL
  Filled 2016-03-03 (×3): qty 1

## 2016-03-03 MED ORDER — FENTANYL CITRATE (PF) 100 MCG/2ML IJ SOLN
INTRAMUSCULAR | Status: AC
  Filled 2016-03-03: qty 2

## 2016-03-03 MED ORDER — BUPIVACAINE IN DEXTROSE 0.75-8.25 % IT SOLN
INTRATHECAL | Status: DC | PRN
  Administered 2016-03-03: 1.6 mL via INTRATHECAL

## 2016-03-03 MED ORDER — CLONIDINE HCL 0.1 MG PO TABS
0.1000 mg | ORAL_TABLET | Freq: Every day | ORAL | Status: DC
  Administered 2016-03-04: 0.1 mg via ORAL
  Filled 2016-03-03 (×3): qty 1

## 2016-03-03 MED ORDER — SODIUM CHLORIDE 0.9 % IV SOLN: INTRAVENOUS | Status: DC

## 2016-03-03 MED ORDER — BISACODYL 10 MG RE SUPP: 10.0000 mg | Freq: Every day | RECTAL | Status: DC | PRN

## 2016-03-03 MED ORDER — MIDAZOLAM HCL 5 MG/5ML IJ SOLN
INTRAMUSCULAR | Status: DC | PRN
  Administered 2016-03-03: 2 mg via INTRAVENOUS

## 2016-03-03 MED ORDER — TRANEXAMIC ACID 1000 MG/10ML IV SOLN
1000.0000 mg | Freq: Once | INTRAVENOUS | Status: AC
  Administered 2016-03-03: 1000 mg via INTRAVENOUS
  Filled 2016-03-03: qty 10

## 2016-03-03 MED ORDER — ACETAMINOPHEN 500 MG PO TABS
1000.0000 mg | ORAL_TABLET | Freq: Four times a day (QID) | ORAL | Status: AC
  Administered 2016-03-03 – 2016-03-04 (×4): 1000 mg via ORAL
  Filled 2016-03-03 (×3): qty 2

## 2016-03-03 MED ORDER — DEXAMETHASONE SODIUM PHOSPHATE 10 MG/ML IJ SOLN
10.0000 mg | Freq: Once | INTRAMUSCULAR | Status: AC
  Administered 2016-03-04: 10 mg via INTRAVENOUS
  Filled 2016-03-03: qty 1

## 2016-03-03 MED ORDER — PROPOFOL 10 MG/ML IV BOLUS
INTRAVENOUS | Status: AC
  Filled 2016-03-03: qty 20

## 2016-03-03 MED ORDER — BUPIVACAINE LIPOSOME 1.3 % IJ SUSP
INTRAMUSCULAR | Status: DC | PRN
  Administered 2016-03-03: 20 mL

## 2016-03-03 MED ORDER — MIDAZOLAM HCL 2 MG/2ML IJ SOLN
INTRAMUSCULAR | Status: AC
  Filled 2016-03-03: qty 2

## 2016-03-03 MED ORDER — ACETAMINOPHEN 325 MG PO TABS
650.0000 mg | ORAL_TABLET | Freq: Four times a day (QID) | ORAL | Status: DC | PRN
  Administered 2016-03-04 – 2016-03-05 (×2): 650 mg via ORAL
  Filled 2016-03-03 (×3): qty 2

## 2016-03-03 MED ORDER — DEXAMETHASONE SODIUM PHOSPHATE 10 MG/ML IJ SOLN
10.0000 mg | Freq: Once | INTRAMUSCULAR | Status: AC
  Administered 2016-03-03: 10 mg via INTRAVENOUS

## 2016-03-03 MED ORDER — HYDROMORPHONE HCL 1 MG/ML IJ SOLN
0.2500 mg | INTRAMUSCULAR | Status: DC | PRN
Start: 2016-03-03 — End: 2016-03-03

## 2016-03-03 MED ORDER — SODIUM CHLORIDE 0.9 % IR SOLN
Status: DC | PRN
  Administered 2016-03-03: 1000 mL

## 2016-03-03 MED ORDER — HYDROMORPHONE HCL 1 MG/ML IJ SOLN
0.5000 mg | INTRAMUSCULAR | Status: DC | PRN
Start: 2016-03-03 — End: 2016-03-05
  Administered 2016-03-03 – 2016-03-04 (×3): 0.5 mg via INTRAVENOUS
  Filled 2016-03-03 (×3): qty 1

## 2016-03-03 MED ORDER — SODIUM CHLORIDE 0.9 % IJ SOLN
INTRAMUSCULAR | Status: AC
  Filled 2016-03-03: qty 50

## 2016-03-03 MED ORDER — DOCUSATE SODIUM 100 MG PO CAPS
100.0000 mg | ORAL_CAPSULE | Freq: Two times a day (BID) | ORAL | Status: DC
  Administered 2016-03-03 – 2016-03-04 (×3): 100 mg via ORAL
  Filled 2016-03-03 (×6): qty 1

## 2016-03-03 MED ORDER — CEFAZOLIN SODIUM-DEXTROSE 2-4 GM/100ML-% IV SOLN
INTRAVENOUS | Status: AC
  Filled 2016-03-03: qty 100

## 2016-03-03 MED ORDER — CEFAZOLIN SODIUM-DEXTROSE 2-4 GM/100ML-% IV SOLN
2.0000 g | INTRAVENOUS | Status: AC
  Administered 2016-03-03: 2 g via INTRAVENOUS
  Filled 2016-03-03: qty 100

## 2016-03-03 MED ORDER — PROPOFOL 500 MG/50ML IV EMUL
INTRAVENOUS | Status: DC | PRN
  Administered 2016-03-03: 75 ug/kg/min via INTRAVENOUS

## 2016-03-03 MED ORDER — ACETAMINOPHEN 650 MG RE SUPP: 650.0000 mg | Freq: Four times a day (QID) | RECTAL | Status: DC | PRN

## 2016-03-03 MED ORDER — METHOCARBAMOL 1000 MG/10ML IJ SOLN
500.0000 mg | Freq: Four times a day (QID) | INTRAVENOUS | Status: DC | PRN
  Administered 2016-03-03: 500 mg via INTRAVENOUS
  Filled 2016-03-03: qty 550
  Filled 2016-03-03 (×2): qty 5

## 2016-03-03 MED ORDER — MENTHOL 3 MG MT LOZG: 1.0000 | LOZENGE | OROMUCOSAL | Status: DC | PRN

## 2016-03-03 SURGICAL SUPPLY — 52 items
BAG DECANTER FOR FLEXI CONT (MISCELLANEOUS) ×2 IMPLANT
BAG SPEC THK2 15X12 ZIP CLS (MISCELLANEOUS) ×1
BAG ZIPLOCK 12X15 (MISCELLANEOUS) ×2 IMPLANT
BANDAGE ACE 6X5 VEL STRL LF (GAUZE/BANDAGES/DRESSINGS) ×1 IMPLANT
BANDAGE ELASTIC 6 VELCRO ST LF (GAUZE/BANDAGES/DRESSINGS) ×1 IMPLANT
BLADE SAG 18X100X1.27 (BLADE) ×2 IMPLANT
BLADE SAW SGTL 11.0X1.19X90.0M (BLADE) ×2 IMPLANT
BOWL SMART MIX CTS (DISPOSABLE) ×2 IMPLANT
CAPT KNEE TOTAL 3 ATTUNE ×1 IMPLANT
CEMENT HV SMART SET (Cement) ×4 IMPLANT
CLOTH BEACON ORANGE TIMEOUT ST (SAFETY) ×2 IMPLANT
CUFF TOURN SGL QUICK 34 (TOURNIQUET CUFF) ×2
CUFF TRNQT CYL 34X4X40X1 (TOURNIQUET CUFF) ×1 IMPLANT
DECANTER SPIKE VIAL GLASS SM (MISCELLANEOUS) ×2 IMPLANT
DRAPE U-SHAPE 47X51 STRL (DRAPES) ×2 IMPLANT
DRSG ADAPTIC 3X8 NADH LF (GAUZE/BANDAGES/DRESSINGS) ×2 IMPLANT
DRSG PAD ABDOMINAL 8X10 ST (GAUZE/BANDAGES/DRESSINGS) ×1 IMPLANT
DURAPREP 26ML APPLICATOR (WOUND CARE) ×2 IMPLANT
ELECT REM PT RETURN 9FT ADLT (ELECTROSURGICAL) ×2
ELECTRODE REM PT RTRN 9FT ADLT (ELECTROSURGICAL) ×1 IMPLANT
EVACUATOR 1/8 PVC DRAIN (DRAIN) ×2 IMPLANT
GAUZE SPONGE 4X4 12PLY STRL (GAUZE/BANDAGES/DRESSINGS) ×2 IMPLANT
GLOVE BIO SURGEON STRL SZ7.5 (GLOVE) IMPLANT
GLOVE BIO SURGEON STRL SZ8 (GLOVE) ×2 IMPLANT
GLOVE BIOGEL PI IND STRL 6.5 (GLOVE) IMPLANT
GLOVE BIOGEL PI IND STRL 8 (GLOVE) ×1 IMPLANT
GLOVE BIOGEL PI INDICATOR 6.5 (GLOVE)
GLOVE BIOGEL PI INDICATOR 8 (GLOVE) ×1
GLOVE SURG SS PI 6.5 STRL IVOR (GLOVE) IMPLANT
GOWN STRL REUS W/TWL LRG LVL3 (GOWN DISPOSABLE) ×2 IMPLANT
GOWN STRL REUS W/TWL XL LVL3 (GOWN DISPOSABLE) IMPLANT
HANDPIECE INTERPULSE COAX TIP (DISPOSABLE) ×2
IMMOBILIZER KNEE 20 (SOFTGOODS) ×2
IMMOBILIZER KNEE 20 THIGH 36 (SOFTGOODS) ×1 IMPLANT
MANIFOLD NEPTUNE II (INSTRUMENTS) ×2 IMPLANT
NS IRRIG 1000ML POUR BTL (IV SOLUTION) ×2 IMPLANT
PACK TOTAL KNEE CUSTOM (KITS) ×2 IMPLANT
PAD ABD 8X10 STRL (GAUZE/BANDAGES/DRESSINGS) ×1 IMPLANT
PADDING CAST COTTON 6X4 STRL (CAST SUPPLIES) ×4 IMPLANT
POSITIONER SURGICAL ARM (MISCELLANEOUS) ×2 IMPLANT
SET HNDPC FAN SPRY TIP SCT (DISPOSABLE) ×1 IMPLANT
STRIP CLOSURE SKIN 1/2X4 (GAUZE/BANDAGES/DRESSINGS) ×3 IMPLANT
SUT MNCRL AB 4-0 PS2 18 (SUTURE) ×2 IMPLANT
SUT VIC AB 2-0 CT1 27 (SUTURE) ×6
SUT VIC AB 2-0 CT1 TAPERPNT 27 (SUTURE) ×3 IMPLANT
SUT VLOC 180 0 24IN GS25 (SUTURE) ×2 IMPLANT
SYR 50ML LL SCALE MARK (SYRINGE) ×2 IMPLANT
TRAY FOLEY W/METER SILVER 14FR (SET/KITS/TRAYS/PACK) ×2 IMPLANT
TRAY FOLEY W/METER SILVER 16FR (SET/KITS/TRAYS/PACK) ×1 IMPLANT
WATER STERILE IRR 1500ML POUR (IV SOLUTION) ×2 IMPLANT
WRAP KNEE MAXI GEL POST OP (GAUZE/BANDAGES/DRESSINGS) ×2 IMPLANT
YANKAUER SUCT BULB TIP 10FT TU (MISCELLANEOUS) ×2 IMPLANT

## 2016-03-03 NOTE — Anesthesia Postprocedure Evaluation (Signed)
Anesthesia Post Note  Patient: Paige Woodard  Procedure(s) Performed: Procedure(s) (LRB): LEFT TOTAL KNEE ARTHROPLASTY (Left)  Patient location during evaluation: PACU Anesthesia Type: Spinal and MAC Level of consciousness: awake and alert Pain management: pain level controlled Vital Signs Assessment: post-procedure vital signs reviewed and stable Respiratory status: spontaneous breathing and respiratory function stable Cardiovascular status: blood pressure returned to baseline and stable Postop Assessment: spinal receding Anesthetic complications: no    Last Vitals:  Filed Vitals:   03/03/16 1430 03/03/16 1442  BP: 128/74 119/70  Pulse: 57 58  Temp: 36.6 C 36.7 C  Resp: 11 16    Last Pain:  Filed Vitals:   03/03/16 1600  PainSc: 4                  Kennieth RadFitzgerald, [name]

## 2016-03-03 NOTE — Transfer of Care (Signed)
Immediate Anesthesia Transfer of Care Note  Patient: Paige HeritageNancy A Enslow  Procedure(s) Performed: Procedure(s): LEFT TOTAL KNEE ARTHROPLASTY (Left)  Patient Location: PACU  Anesthesia Type:Spinal  Level of Consciousness:  awake, patient cooperative and responds to stimulation  Airway & Oxygen Therapy:Patient Spontanous Breathing and Patient connected to face mask oxgen  Post-op Assessment:  Report given to PACU RN and Post -op Vital signs reviewed and stable  Post vital signs:  Reviewed and stable, spinal level T11  Last Vitals: There were no vitals filed for this visit.  Complications: No apparent anesthesia complications

## 2016-03-03 NOTE — Evaluation (Signed)
Physical Therapy Evaluation Patient Details Name: Paige Woodard MRN: [id number] DOB: 1960-11-19 Today's Date: 03/03/2016   History of Present Illness  L TKA  Clinical Impression  Pt is s/p TKA resulting in the deficits listed below (see PT Problem List). Pt ambulated 30' with RW, distance limited by nausea. Good progress expected.  Pt will benefit from skilled PT to increase their independence and safety with mobility to allow discharge to the venue listed below.      Follow Up Recommendations Home health PT;Supervision for mobility/OOB    Equipment Recommendations  3in1 (PT)    Recommendations for Other Services       Precautions / Restrictions Precautions Precautions: Knee Restrictions Weight Bearing Restrictions: No Other Position/Activity Restrictions: WBAT      Mobility  Bed Mobility Overal bed mobility: Modified Independent             General bed mobility comments: HOB up 30*  Transfers Overall transfer level: Needs assistance Equipment used: Rolling walker (2 wheeled) Transfers: Sit to/from Stand Sit to Stand: Min assist         General transfer comment: verbal cues for hand placement  Ambulation/Gait Ambulation/Gait assistance: Min guard Ambulation Distance (Feet): 30 Feet Assistive device: Rolling walker (2 wheeled) Gait Pattern/deviations: Step-to pattern;Decreased stride length   Gait velocity interpretation: Below normal speed for age/gender General Gait Details: steady with RW, no LOB, verbal cues for sequencing, distance limited by nausea  Stairs            Wheelchair Mobility    Modified Rankin (Stroke Patients Only)       Balance Overall balance assessment: Modified Independent                                           Pertinent Vitals/Pain Pain Assessment: 0-10 Pain Score: 2  Pain Location: knee Pain Descriptors / Indicators: Sore Pain Intervention(s): Ice applied;Monitored during  session;Premedicated before session;Limited activity within patient's tolerance    Home Living Family/patient expects to be discharged to:: Private residence Living Arrangements: Spouse/significant other;Children Available Help at Discharge: Family;Available 24 hours/day   Home Access: Stairs to enter   Entrance Stairs-Number of Steps: 1 Home Layout: Two level;Bed/bath upstairs Home Equipment: Walker - 2 wheels;Cane - single point;Crutches;Other (comment) (ice machine) Additional Comments: bathroom on 1st level if needed    Prior Function Level of Independence: Independent               Hand Dominance        Extremity/Trunk Assessment   Upper Extremity Assessment: Overall WFL for tasks assessed           Lower Extremity Assessment: LLE deficits/detail   LLE Deficits / Details: 5-45* AAROM L knee, 3/5 SLR  Cervical / Trunk Assessment: Normal  Communication   Communication: No difficulties  Cognition Arousal/Alertness: Awake/alert Behavior During Therapy: WFL for tasks assessed/performed Overall Cognitive Status: Within Functional Limits for tasks assessed                      General Comments      Exercises Total Joint Exercises Ankle Circles/Pumps: AROM;Both;10 reps;Supine Quad Sets: AROM;Both;5 reps;Supine Heel Slides: AAROM;Left;10 reps;Supine Long Arc Quad: AROM;Left;10 reps;Seated Goniometric ROM: 5-45* L knee AAROM      Assessment/Plan    PT Assessment Patient needs continued PT services  PT Diagnosis Difficulty walking;Acute pain  PT Problem List Decreased strength;Decreased range of motion;Decreased activity tolerance;Pain;Decreased knowledge of use of DME;Decreased mobility  PT Treatment Interventions DME instruction;Gait training;Stair training;Functional mobility training;Therapeutic activities;Patient/family education;Therapeutic exercise   PT Goals (Current goals can be found in the Care Plan section) Acute Rehab PT  Goals Patient Stated Goal: playing tennis PT Goal Formulation: With patient Time For Goal Achievement: 03/10/16 Potential to Achieve Goals: Good    Frequency 7X/week   Barriers to discharge        Co-evaluation               End of Session Equipment Utilized During Treatment: Gait belt Activity Tolerance: Treatment limited secondary to medical complications (Comment) (nausea) Patient left: in chair;with call bell/phone within reach;with chair alarm set           Time: [id number] PT Time Calculation (min) (ACUTE ONLY): 23 min   Charges:   PT Evaluation $PT Eval Low Complexity: 1 Procedure PT Treatments $Gait Training: 8-22 mins   PT G Codes:        Tamala SerUhlenberg, Amariyana Heacox Kistler 03/03/2016, 4:57 PM [phone]

## 2016-03-03 NOTE — Op Note (Signed)
Pre-operative diagnosis- Osteoarthritis  Left knee(s)  Post-operative diagnosis- Osteoarthritis Left knee(s)  Procedure-  Left  Total Knee Arthroplasty (Depuy Attune)  Surgeon- Gus Rankin. Rami Waddle, MD  Assistant- Avel Peace, PA-C   Anesthesia-  Spinal  EBL-* No blood loss amount entered *   Drains Hemovac  Tourniquet time-  Total Tourniquet Time Documented: Thigh (Left) - 28 minutes Total: Thigh (Left) - 28 minutes     Complications- None  Condition-PACU - hemodynamically stable.   Brief Clinical Note  Paige Woodard is a 55 y.o. year old female with end stage OA of her left knee with progressively worsening pain and dysfunction. She has constant pain, with activity and at rest and significant functional deficits with difficulties even with ADLs. She has had extensive non-op management including analgesics, injections of cortisone and viscosupplements, and home exercise program, but remains in significant pain with significant dysfunction. Radiographs show bone on bone arthritis medial and patellofemoral. She presents now for left Total Knee Arthroplasty.    Procedure in detail---   The patient is brought into the operating room and positioned supine on the operating table. After successful administration of  Spinal,   a tourniquet is placed high on the  Left thigh(s) and the lower extremity is prepped and draped in the usual sterile fashion. Time out is performed by the operating team and then the  Left lower extremity is wrapped in Esmarch, knee flexed and the tourniquet inflated to 300 mmHg.       A midline incision is made with a ten blade through the subcutaneous tissue to the level of the extensor mechanism. A fresh blade is used to make a medial parapatellar arthrotomy. Soft tissue over the proximal medial tibia is subperiosteally elevated to the joint line with a knife and into the semimembranosus bursa with a Cobb elevator. Soft tissue over the proximal lateral tibia is  elevated with attention being paid to avoiding the patellar tendon on the tibial tubercle. The patella is everted, knee flexed 90 degrees and the ACL and PCL are removed. Findings are bone on bone medial and patellofemoral with large global osteophytes.        The drill is used to create a starting hole in the distal femur and the canal is thoroughly irrigated with sterile saline to remove the fatty contents. The 5 degree Left  valgus alignment guide is placed into the femoral canal and the distal femoral cutting block is pinned to remove 9 mm off the distal femur. Resection is made with an oscillating saw.      The tibia is subluxed forward and the menisci are removed. The extramedullary alignment guide is placed referencing proximally at the medial aspect of the tibial tubercle and distally along the second metatarsal axis and tibial crest. The block is pinned to remove 2mm off the more deficient medial  side. Resection is made with an oscillating saw. Size 4is the most appropriate size for the tibia and the proximal tibia is prepared with the modular drill and keel punch for that size.      The femoral sizing guide is placed and size 5 is most appropriate. Rotation is marked off the epicondylar axis and confirmed by creating a rectangular flexion gap at 90 degrees. The size 5 cutting block is pinned in this rotation and the anterior, posterior and chamfer cuts are made with the oscillating saw. The intercondylar block is then placed and that cut is made.      Trial size 4  tibial component, trial size 5 posterior stabilized femur and a 10  mm posterior stabilized rotating platform insert trial is placed. Full extension is achieved with excellent varus/valgus and anterior/posterior balance throughout full range of motion. The patella is everted and thickness measured to be 22  mm. Free hand resection is taken to 12 mm, a 35 template is placed, lug holes are drilled, trial patella is placed, and it tracks  normally. Osteophytes are removed off the posterior femur with the trial in place. All trials are removed and the cut bone surfaces prepared with pulsatile lavage. Cement is mixed and once ready for implantation, the size 4 tibial implant, size  5 posterior stabilized femoral component, and the size 35 patella are cemented in place and the patella is held with the clamp. The trial insert is placed and the knee held in full extension. The Exparel (20 ml mixed with 30 ml saline) and .25% Bupivicaine, are injected into the extensor mechanism, posterior capsule, medial and lateral gutters and subcutaneous tissues.  All extruded cement is removed and once the cement is hard the permanent 10 mm posterior stabilized rotating platform insert is placed into the tibial tray.      The wound is copiously irrigated with saline solution and the extensor mechanism closed over a hemovac drain with #1 V-loc suture. The tourniquet is released for a total tourniquet time of 28  minutes. Flexion against gravity is 140 degrees and the patella tracks normally. Subcutaneous tissue is closed with 2.0 vicryl and subcuticular with running 4.0 Monocryl. The incision is cleaned and dried and steri-strips and a bulky sterile dressing are applied. The limb is placed into a knee immobilizer and the patient is awakened and transported to recovery in stable condition.      Please note that a surgical assistant was a medical necessity for this procedure in order to perform it in a safe and expeditious manner. Surgical assistant was necessary to retract the ligaments and vital neurovascular structures to prevent injury to them and also necessary for proper positioning of the limb to allow for anatomic placement of the prosthesis.   Gus RankinFrank V. Yair Dusza, MD    03/03/2016, 12:42 PM

## 2016-03-03 NOTE — Anesthesia Preprocedure Evaluation (Addendum)
Anesthesia Evaluation  Patient identified by MRN, date of birth, ID band Patient awake    Reviewed: Allergy & Precautions, NPO status , Patient's Chart, lab work & pertinent test results  Airway Mallampati: III  TM Distance: >3 FB Neck ROM: Full    Dental   Pulmonary neg pulmonary ROS,    breath sounds clear to auscultation       Cardiovascular negative cardio ROS   Rhythm:Regular Rate:Normal     Neuro/Psych negative neurological ROS     GI/Hepatic negative GI ROS, Neg liver ROS,   Endo/Other  negative endocrine ROS  Renal/GU negative Renal ROS     Musculoskeletal  (+) Arthritis ,   Abdominal   Peds  Hematology negative hematology ROS (+)   Anesthesia Other Findings   Reproductive/Obstetrics                            Lab Results  Component Value Date   WBC 4.0 02/26/2016   HGB 14.4 02/26/2016   HCT 43.0 02/26/2016   MCV 93.3 02/26/2016   PLT 278 02/26/2016   Lab Results  Component Value Date   CREATININE 0.74 02/26/2016   BUN 13 02/26/2016   NA 140 02/26/2016   K 4.0 02/26/2016   CL 107 02/26/2016   CO2 26 02/26/2016   Lab Results  Component Value Date   INR 0.92 02/26/2016    Anesthesia Physical Anesthesia Plan  ASA: I  Anesthesia Plan: MAC and Spinal   Post-op Pain Management:    Induction: Intravenous  Airway Management Planned: Natural Airway and Simple Face Mask  Additional Equipment:   Intra-op Plan:   Post-operative Plan:   Informed Consent: I have reviewed the patients History and Physical, chart, labs and discussed the procedure including the risks, benefits and alternatives for the proposed anesthesia with the patient or authorized representative who has indicated his/her understanding and acceptance.     Plan Discussed with: CRNA  Anesthesia Plan Comments:        Anesthesia Quick Evaluation

## 2016-03-03 NOTE — Anesthesia Procedure Notes (Signed)
Spinal Patient location during procedure: OR Start time: 03/03/2016 11:38 AM End time: 03/03/2016 11:42 AM Reason for block: at surgeon's request Staffing Resident/CRNA: Anne Fu Performed by: resident/CRNA  Preanesthetic Checklist Completed: patient identified, site marked, surgical consent, pre-op evaluation, timeout performed, IV checked, risks and benefits discussed, monitors and equipment checked and at surgeon's request Spinal Block Patient position: sitting Prep: ChloraPrep Patient monitoring: heart rate, continuous pulse ox and blood pressure Approach: right paramedian Location: L2-3 Injection technique: single-shot Needle Needle type: Whitacre  Needle gauge: 25 G Needle length: 9 cm Assessment Sensory level: T6 Additional Notes Expiration date of kit checked and confirmed. Patient tolerated procedure well, without complications. X 1 attempt with noted clear CSF return. Loss of motor and sensory on exam post injection.

## 2016-03-03 NOTE — Interval H&P Note (Signed)
History and Physical Interval Note:  03/03/2016 10:53 AM  Paige AbrahamNancy A Woodard  has presented today for surgery, with the diagnosis of LEFT KNEE OA  The various methods of treatment have been discussed with the patient and family. After consideration of risks, benefits and other options for treatment, the patient has consented to  Procedure(s): LEFT TOTAL KNEE ARTHROPLASTY (Left) as a surgical intervention .  The patient's history has been reviewed, patient examined, no change in status, stable for surgery.  I have reviewed the patient's chart and labs.  Questions were answered to the patient's satisfaction.     Loanne DrillingALUISIO,[name]

## 2016-03-04 LAB — BASIC METABOLIC PANEL
Anion gap: 6 (ref 5–15)
BUN: 10 mg/dL (ref 6–20)
CO2: 26 mmol/L (ref 22–32)
Calcium: 8.6 mg/dL — ABNORMAL LOW (ref 8.9–10.3)
Chloride: 106 mmol/L (ref 101–111)
Creatinine, Ser: 0.65 mg/dL (ref 0.44–1.00)
GFR calc Af Amer: >60 mL/min (ref >60)
GFR calc non Af Amer: >60 mL/min (ref >60)
Glucose, Bld: 152 mg/dL — ABNORMAL HIGH (ref 65–99)
Potassium: 4 mmol/L (ref 3.5–5.1)
Sodium: 138 mmol/L (ref 135–145)

## 2016-03-04 LAB — CBC
HCT: 33.9 % — ABNORMAL LOW (ref 36.0–46.0)
Hemoglobin: 11.4 g/dL — ABNORMAL LOW (ref 12.0–15.0)
MCH: 31.5 pg (ref 26.0–34.0)
MCHC: 33.6 g/dL (ref 30.0–36.0)
MCV: 93.6 fL (ref 78.0–100.0)
Platelets: 217 10*3/uL (ref 150–400)
RBC: 3.62 MIL/uL — ABNORMAL LOW (ref 3.87–5.11)
RDW: 12.5 % (ref 11.5–15.5)
WBC: 7.3 10*3/uL (ref 4.0–10.5)

## 2016-03-04 MED ORDER — SODIUM CHLORIDE 0.9 % IV BOLUS (SEPSIS)
250.0000 mL | Freq: Once | INTRAVENOUS | Status: AC
  Administered 2016-03-04: 250 mL via INTRAVENOUS

## 2016-03-04 MED ORDER — RIVAROXABAN 10 MG PO TABS: 10.0000 mg | ORAL_TABLET | Freq: Every day | ORAL | Status: DC

## 2016-03-04 MED ORDER — HYDROMORPHONE HCL 2 MG PO TABS: 2.0000 mg | ORAL_TABLET | ORAL | Status: DC | PRN

## 2016-03-04 MED ORDER — METHOCARBAMOL 500 MG PO TABS: 500.0000 mg | ORAL_TABLET | Freq: Four times a day (QID) | ORAL | Status: DC | PRN

## 2016-03-04 NOTE — Progress Notes (Signed)
Physical Therapy Treatment Patient Details Name: Paige Woodard MRN: [id number] DOB: 07/13/1961 Today's Date: 03/04/2016    History of Present Illness L TKA    PT Comments    POD # 1 pm session Pt feeling better but still not great.  Tolerated amb in hallway with no c/o dizziness.  Performed some TKR TE's followed by ICE.  Follow Up Recommendations  Home health PT;Supervision for mobility/OOB     Equipment Recommendations  3in1 (PT)    Recommendations for Other Services       Precautions / Restrictions Precautions Precautions: Knee Precaution Comments: instructed on KI use for amb and stairs  Required Braces or Orthoses: Knee Immobilizer - Left Restrictions Weight Bearing Restrictions: No Other Position/Activity Restrictions: WBAT    Mobility  Bed Mobility Overal bed mobility: Needs Assistance Bed Mobility: Supine to Sit Rolling: Supervision         General bed mobility comments: oob  Transfers Overall transfer level: Needs assistance Equipment used: Rolling walker (2 wheeled) Transfers: Sit to/from Stand Sit to Stand: Min assist         General transfer comment: assist to rise and steady.  Cues for UE/LE placement  Ambulation/Gait Ambulation/Gait assistance: Min guard Ambulation Distance (Feet): 38 Feet Assistive device: Rolling walker (2 wheeled) Gait Pattern/deviations: Step-to pattern;Step-through pattern Gait velocity: decreased   General Gait Details: <25% Vc's on proper sequencing and safety with turns   Information systems managertairs            Wheelchair Mobility    Modified Rankin (Stroke Patients Only)       Balance                                    Cognition Arousal/Alertness: Awake/alert Behavior During Therapy: WFL for tasks assessed/performed Overall Cognitive Status: Within Functional Limits for tasks assessed                      Exercises   Total Knee Replacement TE's 10 reps B LE ankle pumps 10 reps towel  squeezes 10 reps knee presses 10 reps heel slides  10 reps SAQ's 10 reps SLR's 10 reps ABD Followed by ICE    General Comments        Pertinent Vitals/Pain Pain Assessment: 0-10 Pain Score: 2  Pain Location: knee Pain Descriptors / Indicators: Sore Pain Intervention(s): Limited activity within patient's tolerance;Monitored during session;Premedicated before session;Repositioned;Ice applied    Home Living Family/patient expects to be discharged to:: Private residence Living Arrangements: Spouse/significant other;Children Available Help at Discharge: Family;Available 24 hours/day           Additional Comments: bathroom on 1st level if needed    Prior Function Level of Independence: Independent          PT Goals (current goals can now be found in the care plan section) Acute Rehab PT Goals Patient Stated Goal: playing tennis Progress towards PT goals: Progressing toward goals    Frequency  7X/week    PT Plan Current plan remains appropriate    Co-evaluation             End of Session Equipment Utilized During Treatment: Gait belt Activity Tolerance: Patient tolerated treatment well Patient left: in bed;with call bell/phone within reach     Time: 1424-1449 PT Time Calculation (min) (ACUTE ONLY): 25 min  Charges:  $Gait Training: 8-22 mins $Therapeutic Exercise: 8-22 mins  G Codes:      Rica Koyanagi  PTA [facility]  Acute  Rehab Pager      [phone]

## 2016-03-04 NOTE — Discharge Summary (Signed)
Physician Discharge Summary   Patient ID: AURIEL KIST MRN: [id number] DOB/AGE: Jun 15, 1961 55 y.o.  Admit date: 03/03/2016 Discharge date: 03/05/2016  Primary Diagnosis: Primary osteoarthritis left knee  Admission Diagnoses:  Past Medical History  Diagnosis Date  . No pertinent past medical history   . Neuromuscular disorder (HCC)     carpal tunnel / Bil  . Arthritis    Discharge Diagnoses:   Principal Problem:   OA (osteoarthritis) of knee  Estimated body mass index is 25.05 kg/(m^2) as calculated from the following:   Height as of this encounter: '5\' 2"'  (1.575 m).   Weight as of this encounter: 62.143 kg (137 lb).  Procedure:  Procedure(s) (LRB): LEFT TOTAL KNEE ARTHROPLASTY (Left)   Consults: None  HPI: Genevieve Norlander, 55 y.o. female, has a history of pain and functional disability in the left knee due to trauma and arthritis and has failed non-surgical conservative treatments for greater than 12 weeks to includeNSAID's and/or analgesics, corticosteriod injections, flexibility and strengthening excercises, supervised PT with diminished ADL's post treatment and activity modification. Onset of symptoms was gradual, starting 1 year ago with gradually worsening course since that time. The patient noted prior procedures on the knee to include arthroscopy and menisectomy on the left knee(s). Patient currently rates pain in the left knee(s) at 8 out of 10 with activity. Patient has night pain, worsening of pain with activity and weight bearing, pain that interferes with activities of daily living, pain with passive range of motion, crepitus and joint swelling. Patient has evidence of periarticular osteophytes and joint space narrowing by imaging studies. There is no active infection.  Laboratory Data: Admission on 03/03/2016  Component Date Value Ref Range Status  . WBC 03/04/2016 7.3  4.0 - 10.5 K/uL Final  . RBC 03/04/2016 3.62* 3.87 - 5.11 MIL/uL Final  . Hemoglobin  03/04/2016 11.4* 12.0 - 15.0 g/dL Final  . HCT 03/04/2016 33.9* 36.0 - 46.0 % Final  . MCV 03/04/2016 93.6  78.0 - 100.0 fL Final  . MCH 03/04/2016 31.5  26.0 - 34.0 pg Final  . MCHC 03/04/2016 33.6  30.0 - 36.0 g/dL Final  . RDW 03/04/2016 12.5  11.5 - 15.5 % Final  . Platelets 03/04/2016 217  150 - 400 K/uL Final  . Sodium 03/04/2016 138  135 - 145 mmol/L Final  . Potassium 03/04/2016 4.0  3.5 - 5.1 mmol/L Final  . Chloride 03/04/2016 106  101 - 111 mmol/L Final  . CO2 03/04/2016 26  22 - 32 mmol/L Final  . Glucose, Bld 03/04/2016 152* 65 - 99 mg/dL Final  . BUN 03/04/2016 10  6 - 20 mg/dL Final  . Creatinine, Ser 03/04/2016 0.65  0.44 - 1.00 mg/dL Final  . Calcium 03/04/2016 8.6* 8.9 - 10.3 mg/dL Final  . GFR calc non Af Amer 03/04/2016 >60  >60 mL/min Final  . GFR calc Af Amer 03/04/2016 >60  >60 mL/min Final   Comment: (NOTE) The eGFR has been calculated using the CKD EPI equation. This calculation has not been validated in all clinical situations. eGFR's persistently <60 mL/min signify possible Chronic Kidney Disease.   Georgiann Hahn gap 03/04/2016 6  5 - 15 Final  Hospital Outpatient Visit on 02/26/2016  Component Date Value Ref Range Status  . MRSA, PCR 02/26/2016 NEGATIVE  NEGATIVE Final  . Staphylococcus aureus 02/26/2016 NEGATIVE  NEGATIVE Final   Comment:        The Xpert SA Assay (FDA approved for NASAL specimens in patients  over 13 years of age), is one component of a comprehensive surveillance program.  Test performance has been validated by [facility] for patients greater than or equal to 76 year old. It is not intended to diagnose infection nor to guide or monitor treatment.   Marland Kitchen aPTT 02/26/2016 26  24 - 37 seconds Final  . WBC 02/26/2016 4.0  4.0 - 10.5 K/uL Final  . RBC 02/26/2016 4.61  3.87 - 5.11 MIL/uL Final  . Hemoglobin 02/26/2016 14.4  12.0 - 15.0 g/dL Final  . HCT 02/26/2016 43.0  36.0 - 46.0 % Final  . MCV 02/26/2016 93.3  78.0 - 100.0 fL Final  .  MCH 02/26/2016 31.2  26.0 - 34.0 pg Final  . MCHC 02/26/2016 33.5  30.0 - 36.0 g/dL Final  . RDW 02/26/2016 12.4  11.5 - 15.5 % Final  . Platelets 02/26/2016 278  150 - 400 K/uL Final  . Sodium 02/26/2016 140  135 - 145 mmol/L Final  . Potassium 02/26/2016 4.0  3.5 - 5.1 mmol/L Final  . Chloride 02/26/2016 107  101 - 111 mmol/L Final  . CO2 02/26/2016 26  22 - 32 mmol/L Final  . Glucose, Bld 02/26/2016 97  65 - 99 mg/dL Final  . BUN 02/26/2016 13  6 - 20 mg/dL Final  . Creatinine, Ser 02/26/2016 0.74  0.44 - 1.00 mg/dL Final  . Calcium 02/26/2016 9.6  8.9 - 10.3 mg/dL Final  . Total Protein 02/26/2016 7.1  6.5 - 8.1 g/dL Final  . Albumin 02/26/2016 4.8  3.5 - 5.0 g/dL Final  . AST 02/26/2016 20  15 - 41 U/L Final  . ALT 02/26/2016 13* 14 - 54 U/L Final  . Alkaline Phosphatase 02/26/2016 54  38 - 126 U/L Final  . Total Bilirubin 02/26/2016 0.7  0.3 - 1.2 mg/dL Final  . GFR calc non Af Amer 02/26/2016 >60  >60 mL/min Final  . GFR calc Af Amer 02/26/2016 >60  >60 mL/min Final   Comment: (NOTE) The eGFR has been calculated using the CKD EPI equation. This calculation has not been validated in all clinical situations. eGFR's persistently <60 mL/min signify possible Chronic Kidney Disease.   . Anion gap 02/26/2016 7  5 - 15 Final  . Prothrombin Time 02/26/2016 12.6  11.6 - 15.2 seconds Final  . INR 02/26/2016 0.92  0.00 - 1.49 Final  . ABO/RH(D) 02/26/2016 B POS   Final  . Antibody Screen 02/26/2016 NEG   Final  . Sample Expiration 02/26/2016 03/11/2016   Final  . Extend sample reason 02/26/2016 NO TRANSFUSIONS OR PREGNANCY IN THE PAST 3 MONTHS   Final  . Color, Urine 02/26/2016 YELLOW  YELLOW Final  . APPearance 02/26/2016 CLEAR  CLEAR Final  . Specific Gravity, Urine 02/26/2016 1.005  1.005 - 1.030 Final  . pH 02/26/2016 5.5  5.0 - 8.0 Final  . Glucose, UA 02/26/2016 NEGATIVE  NEGATIVE mg/dL Final  . Hgb urine dipstick 02/26/2016 NEGATIVE  NEGATIVE Final  . Bilirubin Urine  02/26/2016 NEGATIVE  NEGATIVE Final  . Ketones, ur 02/26/2016 NEGATIVE  NEGATIVE mg/dL Final  . Protein, ur 02/26/2016 NEGATIVE  NEGATIVE mg/dL Final  . Nitrite 02/26/2016 NEGATIVE  NEGATIVE Final  . Leukocytes, UA 02/26/2016 NEGATIVE  NEGATIVE Final   MICROSCOPIC NOT DONE ON URINES WITH NEGATIVE PROTEIN, BLOOD, LEUKOCYTES, NITRITE, OR GLUCOSE <1000 mg/dL.  . ABO/RH(D) 02/26/2016 B POS   Final      Hospital Course: JAKERRIA KINGBIRD is a 55 y.o. who was  admitted to [facility]. They were brought to the operating room on 03/03/2016 and underwent Procedure(s): LEFT TOTAL KNEE ARTHROPLASTY.  Patient tolerated the procedure well and was later transferred to the recovery room and then to the orthopaedic floor for postoperative care.  They were given PO and IV analgesics for pain control following their surgery.  They were given 24 hours of postoperative antibiotics of  Anti-infectives    Start     Dose/Rate Route Frequency Ordered Stop   03/03/16 1800  ceFAZolin (ANCEF) IVPB 2g/100 mL premix     2 g 200 mL/hr over 30 Minutes Intravenous Every 6 hours 03/03/16 1440 03/04/16 0032   03/03/16 0934  ceFAZolin (ANCEF) IVPB 2g/100 mL premix     2 g 200 mL/hr over 30 Minutes Intravenous On call to O.R. 03/03/16 7124 03/03/16 1145     and started on DVT prophylaxis in the form of Xarelto.   PT and OT were ordered for total joint protocol.  Discharge planning consulted to help with postop disposition and equipment needs.  Patient had a fair night on the evening of surgery.  They started to get up OOB with therapy on day one. Hemovac drain was pulled without difficulty. She had some issues with lightheadedness and poor appetite. Continued to work with therapy into day two.  Dressing was changed on day two and the incision was clean and dry.  The patient had progressed with therapy and meeting their goals.  Incision was healing well.  Patient was seen in rounds and was ready to go home.   Diet: Cardiac  diet Activity:WBAT Follow-up:in 2 weeks Disposition - Home Discharged Condition: good   Discharge Instructions    Call MD / Call 911    Complete by:  As directed   If you experience chest pain or shortness of breath, CALL 911 and be transported to the hospital emergency room.  If you develope a fever above 101 F, pus (white drainage) or increased drainage or redness at the wound, or calf pain, call your surgeon's office.     Change dressing    Complete by:  As directed   Change dressing daily with sterile 4 x 4 inch gauze dressing and apply TED hose. Do not submerge the incision under water.     Constipation Prevention    Complete by:  As directed   Drink plenty of fluids.  Prune juice may be helpful.  You may use a stool softener, such as Colace (over the counter) 100 mg twice a day.  Use MiraLax (over the counter) for constipation as needed.     Diet - low sodium heart healthy    Complete by:  As directed      Discharge instructions    Complete by:  As directed   Pick up stool softner and laxative for home use following surgery while on pain medications. Do not submerge incision under water. Please use good hand washing techniques while changing dressing each day. May shower starting three days after surgery. Please use a clean towel to pat the incision dry following showers. Continue to use ice for pain and swelling after surgery. Do not use any lotions or creams on the incision until instructed by your surgeon.  Take Xarelto for two and a half more weeks, then discontinue Xarelto. Once the patient has completed the blood thinner regimen, then take a Baby 81 mg Aspirin daily for three more weeks.  Postoperative Constipation Protocol  Constipation - defined medically as  fewer than three stools per week and severe constipation as less than one stool per week.  One of the most common issues patients have following surgery is constipation.  Even if you have a regular bowel pattern  at home, your normal regimen is likely to be disrupted due to multiple reasons following surgery.  Combination of anesthesia, postoperative narcotics, change in appetite and fluid intake all can affect your bowels.  In order to avoid complications following surgery, here are some recommendations in order to help you during your recovery period.  Colace (docusate) - Pick up an over-the-counter form of Colace or another stool softener and take twice a day as long as you are requiring postoperative pain medications.  Take with a full glass of water daily.  If you experience loose stools or diarrhea, hold the colace until you stool forms back up.  If your symptoms do not get better within 1 week or if they get worse, check with your doctor.  Dulcolax (bisacodyl) - Pick up over-the-counter and take as directed by the product packaging as needed to assist with the movement of your bowels.  Take with a full glass of water.  Use this product as needed if not relieved by Colace only.   MiraLax (polyethylene glycol) - Pick up over-the-counter to have on hand.  MiraLax is a solution that will increase the amount of water in your bowels to assist with bowel movements.  Take as directed and can mix with a glass of water, juice, soda, coffee, or tea.  Take if you go more than two days without a movement. Do not use MiraLax more than once per day. Call your doctor if you are still constipated or irregular after using this medication for 7 days in a row.  If you continue to have problems with postoperative constipation, please contact the office for further assistance and recommendations.  If you experience "the worst abdominal pain ever" or develop nausea or vomiting, please contact the office immediatly for further recommendations for treatment.     Do not put a pillow under the knee. Place it under the heel.    Complete by:  As directed      Do not sit on low chairs, stoools or toilet seats, as it may be difficult  to get up from low surfaces    Complete by:  As directed      Driving restrictions    Complete by:  As directed   No driving until released by the physician.     Increase activity slowly as tolerated    Complete by:  As directed      Lifting restrictions    Complete by:  As directed   No lifting until released by the physician.     Patient may shower    Complete by:  As directed   You may shower without a dressing once there is no drainage.  Do not wash over the wound.  If drainage remains, do not shower until drainage stops.     TED hose    Complete by:  As directed   Use stockings (TED hose) for 3 weeks on both leg(s).  You may remove them at night for sleeping.     Weight bearing as tolerated    Complete by:  As directed   Laterality:  left  Extremity:  Lower            Medication List    STOP taking these medications  ibuprofen 200 MG tablet  Commonly known as:  ADVIL,MOTRIN      TAKE these medications        cloNIDine 0.1 MG tablet  Commonly known as:  CATAPRES  Take 0.1 mg by mouth at bedtime.     HYDROmorphone 2 MG tablet  Commonly known as:  DILAUDID  Take 1-2 tablets (2-4 mg total) by mouth every 4 (four) hours as needed for moderate pain or severe pain.     methocarbamol 500 MG tablet  Commonly known as:  ROBAXIN  Take 1 tablet (500 mg total) by mouth every 6 (six) hours as needed for muscle spasms.     rivaroxaban 10 MG Tabs tablet  Commonly known as:  XARELTO  Take 1 tablet (10 mg total) by mouth daily with breakfast. Take Xarelto for two and a half more weeks, then discontinue Xarelto. Once the patient has completed the blood thinner regimen, then take a Baby 81 mg Aspirin daily for three more weeks.     tetrahydrozoline 0.05 % ophthalmic solution  Place 1 drop into both eyes 4 (four) times daily as needed (For eye irritation.).           Follow-up Information    Follow up with Gearlean Alf, MD On 03/18/2016.   Specialty:  Orthopedic  Surgery   Why:  Call office at [phone] to setup appointment on Tuesday 03/18/16 with Dr. Wynelle Link.   Contact information:   [address] [place] [address] [phone]       Signed: Ardeen Jourdain, PA-C Orthopaedic Surgery 03/05/2016 [phone]

## 2016-03-04 NOTE — Progress Notes (Signed)
Physical Therapy Treatment Patient Details Name: Paige Woodard MRN: [id number] DOB: 1961/09/19 Today's Date: 03/04/2016    History of Present Illness L TKA    PT Comments    POD # 1 am session Limited mobility due to max c/o dizziness.  Allowed increased time with each position change with little improvement.  [facility] BP 97/46  HR 55.  Pt pale and now c/o nausea.  Assisted to recliner and reported to RN.    Follow Up Recommendations  Home health PT;Supervision for mobility/OOB     Equipment Recommendations  3in1 (PT)    Recommendations for Other Services       Precautions / Restrictions Precautions Precautions: None;Knee Precaution Comments: instructed on KI use for amb and stairs  Restrictions Weight Bearing Restrictions: No Other Position/Activity Restrictions: WBAT    Mobility  Bed Mobility Overal bed mobility: Needs Assistance Bed Mobility: Supine to Sit Rolling: Supervision         General bed mobility comments: increased time  Transfers Overall transfer level: Needs assistance Equipment used: Rolling walker (2 wheeled) Transfers: Sit to/from Stand Sit to Stand: Min assist         General transfer comment: verbal cues for hand placement, increased time.  Max c/o dizziness.  [facility] then to [facility].    Ambulation/Gait             General Gait Details: unable to attempt due to low BP and c/o dizziness.  Reported to RN.     Stairs            Wheelchair Mobility    Modified Rankin (Stroke Patients Only)       Balance                                    Cognition Arousal/Alertness: Awake/alert Behavior During Therapy: WFL for tasks assessed/performed Overall Cognitive Status: Within Functional Limits for tasks assessed                      Exercises      General Comments        Pertinent Vitals/Pain Pain Assessment: 0-10 Pain Score: 2  Pain Location:  knee Pain Descriptors / Indicators: Sore Pain Intervention(s): Monitored during session;Premedicated before session;Repositioned;Ice applied    Home Living                      Prior Function            PT Goals (current goals can now be found in the care plan section) Progress towards PT goals: Progressing toward goals    Frequency       PT Plan Current plan remains appropriate    Co-evaluation             End of Session Equipment Utilized During Treatment: Gait belt Activity Tolerance: Other (comment) (limited due to dizziness) Patient left: in chair;with family/visitor present     Time: 1132-1200 PT Time Calculation (min) (ACUTE ONLY): 28 min  Charges:  $Gait Training: 8-22 mins $Therapeutic Activity: 8-22 mins                    G Codes:      Felecia ShellingLori Halia Franey  PTA [facility]  Acute  Rehab Pager      [phone]

## 2016-03-04 NOTE — Progress Notes (Signed)
OT Cancellation Note  Patient Details Name: Paige Woodard MRN: [id number] DOB: 1961-04-01   Cancelled Treatment:    Reason Eval/Treat Not Completed: Pain limiting ability to participate.  Pt has received pain medication but pain is "awful" right now. Will return later.    Ramonita Koenig 03/04/2016, 8:37 AM  Marica OtterMaryellen Osborn Pullin, OTR/L [phone] 03/04/2016

## 2016-03-04 NOTE — Progress Notes (Signed)
   Subjective: 1 Day Post-Op Procedure(s) (LRB): LEFT TOTAL KNEE ARTHROPLASTY (Left) Patient reports pain as mild.   Patient seen in rounds with Dr. Lequita HaltAluisio.  Got dizzy this morning when up to bathroom. Give additional fluids and recheck pressure. Patient is well, but has had some minor complaints of pain in the knee, requiring pain medications We will start therapy today.  Plan is to go Home after hospital stay.  Objective: Vital signs in last 24 hours: Temp:  [97.5 F (36.4 C)-98.7 F (37.1 C)] 98.7 F (37.1 C) (06/06 0645) Pulse Rate:  [46-61] 53 (06/06 0645) Resp:  [10-17] 14 (06/06 0645) BP: (109-135)/(65-77) 123/74 mmHg (06/06 0645) SpO2:  [98 %-100 %] 98 % (06/06 0645) Weight:  [62.143 kg (137 lb)] 62.143 kg (137 lb) (06/05 0947)  Intake/Output from previous day:  Intake/Output Summary (Last 24 hours) at 03/04/16 0918 Last data filed at 03/04/16 0810  Gross per 24 hour  Intake   3070 ml  Output   3770 ml  Net   -700 ml    Intake/Output this shift: Total I/O In: -  Out: 300 [Urine:300]  Labs:  Recent Labs  03/04/16 0432  HGB 11.4*    Recent Labs  03/04/16 0432  WBC 7.3  RBC 3.62*  HCT 33.9*  PLT 217    Recent Labs  03/04/16 0432  NA 138  K 4.0  CL 106  CO2 26  BUN 10  CREATININE 0.65  GLUCOSE 152*  CALCIUM 8.6*   No results for input(s): LABPT, INR in the last 72 hours.  EXAM General - Patient is Alert, Appropriate and Oriented Extremity - Neurovascular intact Sensation intact distally Dorsiflexion/Plantar flexion intact Dressing - dressing C/D/I Motor Function - intact, moving foot and toes well on exam.  Hemovac pulled without difficulty.  Past Medical History  Diagnosis Date  . No pertinent past medical history   . Neuromuscular disorder (HCC)     carpal tunnel / Bil  . Arthritis     Assessment/Plan: 1 Day Post-Op Procedure(s) (LRB): LEFT TOTAL KNEE ARTHROPLASTY (Left) Principal Problem:   OA (osteoarthritis) of  knee  Estimated body mass index is 25.05 kg/(m^2) as calculated from the following:   Height as of this encounter: 5\' 2"  (1.575 m).   Weight as of this encounter: 62.143 kg (137 lb). Advance diet Up with therapy Plan for discharge tomorrow Discharge home with home health  DVT Prophylaxis - Xarelto Weight-Bearing as tolerated to left leg D/C O2 and Pulse OX and try on Room Air  Avel Peacerew Perkins, PA-C Orthopaedic Surgery 03/04/2016, 9:18 AM

## 2016-03-04 NOTE — Discharge Instructions (Addendum)
° °Dr. Frank Aluisio °Total Joint Specialist °[facility] °[address] °[place], [place] [address] °[phone] ° °TOTAL KNEE REPLACEMENT POSTOPERATIVE DIRECTIONS ° °Knee Rehabilitation, Guidelines Following Surgery  °Results after knee surgery are often greatly improved when you follow the exercise, range of motion and muscle strengthening exercises prescribed by your doctor. Safety measures are also important to protect the knee from further injury. Any time any of these exercises cause you to have increased pain or swelling in your knee joint, decrease the amount until you are comfortable again and slowly increase them. If you have problems or questions, call your caregiver or physical therapist for advice.  ° °HOME CARE INSTRUCTIONS  °Remove items at home which could result in a fall. This includes throw rugs or furniture in walking pathways.  °· ICE to the affected knee every three hours for 30 minutes at a time and then as needed for pain and swelling.  Continue to use ice on the knee for pain and swelling from surgery. You may notice swelling that will progress down to the foot and ankle.  This is normal after surgery.  Elevate the leg when you are not up walking on it.   °· Continue to use the breathing machine which will help keep your temperature down.  It is common for your temperature to cycle up and down following surgery, especially at night when you are not up moving around and exerting yourself.  The breathing machine keeps your lungs expanded and your temperature down. °· Do not place pillow under knee, focus on keeping the knee straight while resting ° °DIET °You may resume your previous home diet once your are discharged from the hospital. ° °DRESSING / WOUND CARE / SHOWERING °You may shower 3 days after surgery, but keep the wounds dry during showering.  You may use an occlusive plastic wrap (Press'n Seal for example), NO SOAKING/SUBMERGING IN THE BATHTUB.  If the  bandage gets wet, change with a clean dry gauze.  If the incision gets wet, pat the wound dry with a clean towel. °You may start showering once you are discharged home but do not submerge the incision under water. Just pat the incision dry and apply a dry gauze dressing on daily. °Change the surgical dressing daily and reapply a dry dressing each time. ° °ACTIVITY °Walk with your walker as instructed. °Use walker as long as suggested by your caregivers. °Avoid periods of inactivity such as sitting longer than an hour when not asleep. This helps prevent blood clots.  °You may resume a sexual relationship in one month or when given the OK by your doctor.  °You may return to work once you are cleared by your doctor.  °Do not drive a car for 6 weeks or until released by you surgeon.  °Do not drive while taking narcotics. ° °WEIGHT BEARING °Weight bearing as tolerated with assist device (walker, cane, etc) as directed, use it as long as suggested by your surgeon or therapist, typically at least 4-6 weeks. ° °POSTOPERATIVE CONSTIPATION PROTOCOL °Constipation - defined medically as fewer than three stools per week and severe constipation as less than one stool per week. ° °One of the most common issues patients have following surgery is constipation.  Even if you have a regular bowel pattern at home, your normal regimen is likely to be disrupted due to multiple reasons following surgery.  Combination of anesthesia, postoperative narcotics, change in appetite and fluid intake all can affect your bowels.    In order to avoid complications following surgery, here are some recommendations in order to help you during your recovery period. ° °Colace (docusate) - Pick up an over-the-counter form of Colace or another stool softener and take twice a day as long as you are requiring postoperative pain medications.  Take with a full glass of water daily.  If you experience loose stools or diarrhea, hold the colace until you stool forms  back up.  If your symptoms do not get better within 1 week or if they get worse, check with your doctor. ° °Dulcolax (bisacodyl) - Pick up over-the-counter and take as directed by the product packaging as needed to assist with the movement of your bowels.  Take with a full glass of water.  Use this product as needed if not relieved by Colace only.  ° °MiraLax (polyethylene glycol) - Pick up over-the-counter to have on hand.  MiraLax is a solution that will increase the amount of water in your bowels to assist with bowel movements.  Take as directed and can mix with a glass of water, juice, soda, coffee, or tea.  Take if you go more than two days without a movement. °Do not use MiraLax more than once per day. Call your doctor if you are still constipated or irregular after using this medication for 7 days in a row. ° °If you continue to have problems with postoperative constipation, please contact the office for further assistance and recommendations.  If you experience "the worst abdominal pain ever" or develop nausea or vomiting, please contact the office immediatly for further recommendations for treatment. ° °ITCHING ° If you experience itching with your medications, try taking only a single pain pill, or even half a pain pill at a time.  You can also use Benadryl over the counter for itching or also to help with sleep.  ° °TED HOSE STOCKINGS °Wear the elastic stockings on both legs for three weeks following surgery during the day but you may remove then at night for sleeping. ° °MEDICATIONS °See your medication summary on the “After Visit Summary” that the nursing staff will review with you prior to discharge.  You may have some home medications which will be placed on hold until you complete the course of blood thinner medication.  It is important for you to complete the blood thinner medication as prescribed by your surgeon.  Continue your approved medications as instructed at time of  discharge. ° °PRECAUTIONS °If you experience chest pain or shortness of breath - call 911 immediately for transfer to the hospital emergency department.  °If you develop a fever greater that 101 F, purulent drainage from wound, increased redness or drainage from wound, foul odor from the wound/dressing, or calf pain - CONTACT YOUR SURGEON.   °                                                °FOLLOW-UP APPOINTMENTS °Make sure you keep all of your appointments after your operation with your surgeon and caregivers. You should call the office at the above phone number and make an appointment for approximately two weeks after the date of your surgery or on the date instructed by your surgeon outlined in the "After Visit Summary". ° ° °RANGE OF MOTION AND STRENGTHENING EXERCISES  °Rehabilitation of the knee is important following a knee injury or   an operation. After just a few days of immobilization, the muscles of the thigh which control the knee become weakened and shrink (atrophy). Knee exercises are designed to build up the tone and strength of the thigh muscles and to improve knee motion. Often times heat used for twenty to thirty minutes before working out will loosen up your tissues and help with improving the range of motion but do not use heat for the first two weeks following surgery. These exercises can be done on a training (exercise) mat, on the floor, on a table or on a bed. Use what ever works the best and is most comfortable for you Knee exercises include:  °Leg Lifts - While your knee is still immobilized in a splint or cast, you can do straight leg raises. Lift the leg to 60 degrees, hold for 3 sec, and slowly lower the leg. Repeat 10-20 times 2-3 times daily. Perform this exercise against resistance later as your knee gets better.  °Quad and Hamstring Sets - Tighten up the muscle on the front of the thigh (Quad) and hold for 5-10 sec. Repeat this 10-20 times hourly. Hamstring sets are done by pushing the  foot backward against an object and holding for 5-10 sec. Repeat as with quad sets.  °· Leg Slides: Lying on your back, slowly slide your foot toward your buttocks, bending your knee up off the floor (only go as far as is comfortable). Then slowly slide your foot back down until your leg is flat on the floor again. °· Angel Wings: Lying on your back spread your legs to the side as far apart as you can without causing discomfort.  °A rehabilitation program following serious knee injuries can speed recovery and prevent re-injury in the future due to weakened muscles. Contact your doctor or a physical therapist for more information on knee rehabilitation.  ° °IF YOU ARE TRANSFERRED TO A SKILLED REHAB FACILITY °If the patient is transferred to a skilled rehab facility following release from the hospital, a list of the current medications will be sent to the facility for the patient to continue.  When discharged from the skilled rehab facility, please have the facility set up the patient's Home Health Physical Therapy prior to being released. Also, the skilled facility will be responsible for providing the patient with their medications at time of release from the facility to include their pain medication, the muscle relaxants, and their blood thinner medication. If the patient is still at the rehab facility at time of the two week follow up appointment, the skilled rehab facility will also need to assist the patient in arranging follow up appointment in our office and any transportation needs. ° °MAKE SURE YOU:  °Understand these instructions.  °Get help right away if you are not doing well or get worse.  ° ° °Pick up stool softner and laxative for home use following surgery while on pain medications. °Do not submerge incision under water. °Please use good hand washing techniques while changing dressing each day. °May shower starting three days after surgery. °Please use a clean towel to pat the incision dry following  showers. °Continue to use ice for pain and swelling after surgery. °Do not use any lotions or creams on the incision until instructed by your surgeon. ° °Take Xarelto for two and a half more weeks, then discontinue Xarelto. °Once the patient has completed the blood thinner regimen, then take a Baby 81 mg Aspirin daily for three more weeks. ° ° ° °  Information on my medicine - XARELTO® (Rivaroxaban) ° °This medication education was reviewed with me or my healthcare representative as part of my discharge preparation.  ° °Why was Xarelto® prescribed for you? °Xarelto® was prescribed for you to reduce the risk of blood clots forming after orthopedic surgery. The medical term for these abnormal blood clots is venous thromboembolism (VTE). ° °What do you need to know about xarelto® ? °Take your Xarelto® ONCE DAILY at the same time every day. °You may take it either with or without food. ° °If you have difficulty swallowing the tablet whole, you may crush it and mix in applesauce just prior to taking your dose. ° °Take Xarelto® exactly as prescribed by your doctor and DO NOT stop taking Xarelto® without talking to the doctor who prescribed the medication.  Stopping without other VTE prevention medication to take the place of Xarelto® may increase your risk of developing a clot. ° °After discharge, you should have regular check-up appointments with your healthcare provider that is prescribing your Xarelto®.   ° °What do you do if you miss a dose? °If you miss a dose, take it as soon as you remember on the same day then continue your regularly scheduled once daily regimen the next day. Do not take two doses of Xarelto® on the same day.  ° °Important Safety Information °A possible side effect of Xarelto® is bleeding. You should call your healthcare provider right away if you experience any of the following: °? Bleeding from an injury or your nose that does not stop. °? Unusual colored urine (red or dark brown) or unusual  colored stools (red or black). °? Unusual bruising for unknown reasons. °? A serious fall or if you hit your head (even if there is no bleeding). ° °Some medicines may interact with Xarelto® and might increase your risk of bleeding while on Xarelto®. To help avoid this, consult your healthcare provider or pharmacist prior to using any new prescription or non-prescription medications, including herbals, vitamins, non-steroidal anti-inflammatory drugs (NSAIDs) and supplements. ° °This website has more information on Xarelto®: www.xarelto.com. ° ° °

## 2016-03-04 NOTE — Evaluation (Signed)
Occupational Therapy Evaluation Patient Details Name: Paige Woodard A Caillouet MRN: [id number] DOB: 1961/05/11 Today's Date: 03/04/2016    History of Present Illness L TKA   Clinical Impression   This 55 year old female was admitted for the above sx.  All education was completed.  No further OT is needed at this time    Follow Up Recommendations  Supervision/Assistance - 24 hour    Equipment Recommendations  None recommended by OT (does not want 3:1)    Recommendations for Other Services       Precautions / Restrictions Precautions Precautions: Knee Precaution Comments: instructed on KI use for amb and stairs  Required Braces or Orthoses: Knee Immobilizer - Left Restrictions Weight Bearing Restrictions: No Other Position/Activity Restrictions: WBAT      Mobility Bed Mobility            General bed mobility comments: oob  Transfers Overall transfer level: Needs assistance Equipment used: Rolling walker (2 wheeled) Transfers: Sit to/from Stand Sit to Stand: Min assist         General transfer comment: assist to rise and steady.  Cues for UE/LE placement    Balance                                            ADL Overall ADL's : Needs assistance/impaired     Grooming: Min guard;Standing                   Toilet Transfer: Minimal assistance;Ambulation;BSC;RW             General ADL Comments: pt has assistance for adls at home as needed. She has a standard comode with sink next to it.  She does not want 3:1.  Pt stood at sink to brush teeth, BP 161/95 after sitting for several minutes. She reported that she felt a little dizzy, but not like earlier.  Ambulated to bathroom and stood at sink again to wash hands.  Daughter will assist as needed. Educated on tub readiness as well as DME (tub bench option).  Resources given.       Vision     Perception     Praxis      Pertinent Vitals/Pain Pain Assessment: 0-10 Pain Score: 2   Pain Location: knee Pain Descriptors / Indicators: Sore Pain Intervention(s): Limited activity within patient's tolerance;Monitored during session;Premedicated before session;Repositioned;Ice applied     Hand Dominance     Extremity/Trunk Assessment Upper Extremity Assessment Upper Extremity Assessment: Overall WFL for tasks assessed           Communication Communication Communication: No difficulties   Cognition Arousal/Alertness: Awake/alert Behavior During Therapy: WFL for tasks assessed/performed Overall Cognitive Status: Within Functional Limits for tasks assessed                     General Comments       Exercises       Shoulder Instructions      Home Living Family/patient expects to be discharged to:: Private residence Living Arrangements: Spouse/significant other;Children Available Help at Discharge: Family;Available 24 hours/day               Bathroom Shower/Tub: Chief Strategy OfficerTub/shower unit   Bathroom Toilet: Standard         Additional Comments: bathroom on 1st level if needed      Prior Functioning/Environment Level of Independence: Independent  OT Diagnosis: Generalized weakness   OT Problem List:     OT Treatment/Interventions:      OT Goals(Current goals can be found in the care plan section) Acute Rehab OT Goals Patient Stated Goal: playing tennis OT Goal Formulation: All assessment and education complete, DC therapy  OT Frequency:     Barriers to D/C:            Co-evaluation              End of Session    Activity Tolerance: Patient tolerated treatment well Patient left: in chair;with call bell/phone within reach;with family/visitor present   Time: 9528-4132 OT Time Calculation (min): 37 min Charges:  OT General Charges $OT Visit: 1 Procedure OT Evaluation $OT Eval Low Complexity: 1 Procedure OT Treatments $Self Care/Home Management : 8-22 mins G-Codes:    Matisyn Cabeza 03/13/16, 2:56  PM  Marica Otter, OTR/L [phone] 2016/03/13

## 2016-03-04 NOTE — Care Management Note (Signed)
Case Management Note  Patient Details  Name: Paige Woodard MRN: [id number] Date of Birth: 1961-06-16  Subjective/Objective:                  LEFT TOTAL KNEE ARTHROPLASTY (Left) Action/Plan: Discharge planning Expected Discharge Date:  03/05/16               Expected Discharge Plan:  [place]  In-House Referral:     Discharge planning Services  CM Consult  Post Acute Care Choice:  NA Choice offered to:  Patient  DME Arranged:  N/A DME Agency:     HH Arranged:  PT HH Agency:  [facility]  Status of Service:  Completed, signed off  Medicare Important Message Given:    Date Medicare IM Given:    Medicare IM give by:    Date Additional Medicare IM Given:    Additional Medicare Important Message give by:     If discussed at [place] of Stay Meetings, dates discussed:    Additional Comments: CM met with pt in room to offer choice of home health agency.  Pt chooses [facility] to render HHPT.  Pt declines 3n1 and rolling walker.  Referral called to [facility] rep, Tim.  NO other CM needs were communicated. Dellie Catholic, RN 03/04/2016, 1:03 PM

## 2016-03-05 LAB — BASIC METABOLIC PANEL
Anion gap: 6 (ref 5–15)
BUN: 7 mg/dL (ref 6–20)
CO2: 29 mmol/L (ref 22–32)
Calcium: 8.8 mg/dL — ABNORMAL LOW (ref 8.9–10.3)
Chloride: 102 mmol/L (ref 101–111)
Creatinine, Ser: 0.66 mg/dL (ref 0.44–1.00)
GFR calc Af Amer: >60 mL/min (ref >60)
GFR calc non Af Amer: >60 mL/min (ref >60)
Glucose, Bld: 134 mg/dL — ABNORMAL HIGH (ref 65–99)
Potassium: 3.7 mmol/L (ref 3.5–5.1)
Sodium: 137 mmol/L (ref 135–145)

## 2016-03-05 LAB — CBC
HCT: 33.5 % — ABNORMAL LOW (ref 36.0–46.0)
Hemoglobin: 11.2 g/dL — ABNORMAL LOW (ref 12.0–15.0)
MCH: 30.7 pg (ref 26.0–34.0)
MCHC: 33.4 g/dL (ref 30.0–36.0)
MCV: 91.8 fL (ref 78.0–100.0)
Platelets: 205 10*3/uL (ref 150–400)
RBC: 3.65 MIL/uL — ABNORMAL LOW (ref 3.87–5.11)
RDW: 12.3 % (ref 11.5–15.5)
WBC: 6.9 10*3/uL (ref 4.0–10.5)

## 2016-03-05 NOTE — Progress Notes (Signed)
Physical Therapy Treatment Patient Details Name: Paige Woodard MRN: [id number] DOB: 1961/08/20 Today's Date: 03/05/2016    History of Present Illness L TKA    PT Comments    POD # 2 Allowed pt to rest and ICE.  Feeling up to practicing a flight of stairs as her bedroom is upstairs.  With spouse present did so.  Instructed on safe handling, level of assist needed to support L LE and HEP.  All mobility questions addressed. Pt would like to D/C around 2 pm when her next pain med is due. Reported to RN.   Follow Up Recommendations  Home health PT;Supervision for mobility/OOB     Equipment Recommendations  3in1 (PT)    Recommendations for Other Services       Precautions / Restrictions Precautions Precautions: Knee Precaution Comments: instructed on KI use for amb and stairs    Pt and Spouse Required Braces or Orthoses: Knee Immobilizer - Left Knee Immobilizer - Left: On when out of bed or walking;Discontinue once straight leg raise with < 10 degree lag Restrictions Weight Bearing Restrictions: No Other Position/Activity Restrictions: WBAT    Mobility  Bed Mobility               General bed mobility comments: Pt OOB in recliner  Transfers Overall transfer level: Needs assistance Equipment used: Rolling walker (2 wheeled) Transfers: Sit to/from Stand Sit to Stand: Min guard         General transfer comment: assist to rise and steady.  Cues for UE/LE placement.  Increased time  Ambulation/Gait Ambulation/Gait assistance: Min guard Ambulation Distance (Feet): 45 Feet Assistive device: Rolling walker (2 wheeled) Gait Pattern/deviations: Step-to pattern;Step-through pattern;Decreased stance time - left Gait velocity: decreased   General Gait Details: <25% Vc's on proper sequencing and safety with turns.  Increased time   Stairs Stairs: Yes Stairs assistance: Min guard Stair Management: One rail Left;Step to pattern;Forwards;With crutches Number of  Stairs: 12 General stair comments: with spouse perform a flight of stairs with 25% VC's on proper sequencing and safe handling  Wheelchair Mobility    Modified Rankin (Stroke Patients Only)       Balance                                    Cognition Arousal/Alertness: Awake/alert Behavior During Therapy: WFL for tasks assessed/performed Overall Cognitive Status: Within Functional Limits for tasks assessed                      Exercises      General Comments        Pertinent Vitals/Pain Pain Assessment: 0-10 Pain Score: 10-Worst pain ever Pain Location: 6/10 with amb then 10/10 with TE's Pain Intervention(s): Monitored during session;Premedicated before session;Repositioned;Ice applied    Home Living                      Prior Function            PT Goals (current goals can now be found in the care plan section) Progress towards PT goals: Progressing toward goals    Frequency  7X/week    PT Plan Current plan remains appropriate    Co-evaluation             End of Session Equipment Utilized During Treatment: Gait belt;Left knee immobilizer Activity Tolerance: Patient tolerated treatment well Patient left: in chair;with  call bell/phone within reach;with family/visitor present     Time: 1228-1308 PT Time Calculation (min) (ACUTE ONLY): 40 min  Charges:  $Gait Training: 23-37 mins $Therapeutic Activity: 8-22 mins                    G Codes:      Felecia ShellingLori Colm Lyford  PTA [facility]  Acute  Rehab Pager      [phone]

## 2016-03-05 NOTE — Progress Notes (Signed)
   Subjective: 2 Days Post-Op Procedure(s) (LRB): LEFT TOTAL KNEE ARTHROPLASTY (Left) Patient reports pain as mild.   Patient seen in rounds with Dr. Lequita HaltAluisio. Patient is well, but has had some minor complaints of decreased appetite and nausea. She has felt a little lightheaded but has been trying to drink plenty of fluids. No SOB or chest pain.  Plan is to go Home after hospital stay.  Objective: Vital signs in last 24 hours: Temp:  [97.8 F (36.6 C)-99.4 F (37.4 C)] 99 F (37.2 C) (06/07 [id number]) Pulse Rate:  [55-71] 69 (06/07 0608) Resp:  [15-16] 16 (06/07 0608) BP: (97-174)/(46-98) 136/71 mmHg (06/07 0608) SpO2:  [97 %-100 %] 97 % (06/07 0608)  Intake/Output from previous day:  Intake/Output Summary (Last 24 hours) at 03/05/16 0748 Last data filed at 03/05/16 0600  Gross per 24 hour  Intake 1893.75 ml  Output   2600 ml  Net -706.25 ml     Labs:  Recent Labs  03/04/16 0432 03/05/16 0446  HGB 11.4* 11.2*    Recent Labs  03/04/16 0432 03/05/16 0446  WBC 7.3 6.9  RBC 3.62* 3.65*  HCT 33.9* 33.5*  PLT 217 205    Recent Labs  03/04/16 0432 03/05/16 0446  NA 138 137  K 4.0 3.7  CL 106 102  CO2 26 29  BUN 10 7  CREATININE 0.65 0.66  GLUCOSE 152* 134*  CALCIUM 8.6* 8.8*    EXAM General - Patient is Alert and Oriented Extremity - Neurologically intact Intact pulses distally Dorsiflexion/Plantar flexion intact No cellulitis present Compartment soft Dressing/Incision - clean, dry, no drainage Motor Function - intact, moving foot and toes well on exam.   Past Medical History  Diagnosis Date  . No pertinent past medical history   . Neuromuscular disorder (HCC)     carpal tunnel / Bil  . Arthritis     Assessment/Plan: 2 Days Post-Op Procedure(s) (LRB): LEFT TOTAL KNEE ARTHROPLASTY (Left) Principal Problem:   OA (osteoarthritis) of knee  Estimated body mass index is 25.05 kg/(m^2) as calculated from the following:   Height as of this encounter:  5\' 2"  (1.575 m).   Weight as of this encounter: 62.143 kg (137 lb). Advance diet Up with therapy Discharge home with home health  DVT Prophylaxis - Xarelto Weight-Bearing as tolerated to left leg  Will hopefully see improvement in appetite today. Will do two sessions of PT before DC home this afternoon.   Dimitri PedAmber Elley Harp, PA-C Orthopaedic Surgery 03/05/2016, 7:48 AM

## 2016-03-05 NOTE — Progress Notes (Signed)
Pt had scant bleeding from site just right of incision. RN cleaned site and placed 2X2 gauzxe with tape over site. No bleeding at this time. No erythema or other complications at the site.

## 2016-03-05 NOTE — Progress Notes (Signed)
Physical Therapy Treatment Patient Details Name: Paige Woodard MRN: [id number] DOB: [birth year] Today's Date: 03/05/2016    History of Present Illness L TKA    PT Comments    POD # 2 am session Pt c/o feeling "funny" when she gets up.  C/O back pain which she stated is new.  C/O increased knee swelling which ACE is now off and expected.  Applied KI and instructed to wear for long amb and stairs.  Spouse present.  Assisted with amb in hallway.  Slow but increased distance.  "Feels better when I walk".  Stared to perform TKR TE's however unable to complete due to increased knee pain to 10/10 and emotional "worried" behavior.  Applied ICE and will allow pt a break.  Return at later time today to complete session.    Follow Up Recommendations  Home health PT;Supervision for mobility/OOB     Equipment Recommendations  3in1 (PT)    Recommendations for Other Services       Precautions / Restrictions Precautions Precautions: Knee Precaution Comments: instructed on KI use for amb and stairs    Pt and Spouse Required Braces or Orthoses: Knee Immobilizer - Left Knee Immobilizer - Left: On when out of bed or walking;Discontinue once straight leg raise with < 10 degree lag Restrictions Weight Bearing Restrictions: No Other Position/Activity Restrictions: WBAT    Mobility  Bed Mobility               General bed mobility comments: Pt OOB in recliner  Transfers Overall transfer level: Needs assistance Equipment used: Rolling walker (2 wheeled) Transfers: Sit to/from Stand Sit to Stand: Min guard         General transfer comment: assist to rise and steady.  Cues for UE/LE placement.  Increased time  Ambulation/Gait Ambulation/Gait assistance: Min guard Ambulation Distance (Feet): 45 Feet Assistive device: Rolling walker (2 wheeled) Gait Pattern/deviations: Step-to pattern;Step-through pattern;Decreased stance time - left Gait velocity: decreased   General Gait Details:  <25% Vc's on proper sequencing and safety with turns.  Increased time   Stairs Stairs: Yes Stairs assistance: Min guard Stair Management: No rails;Step to pattern;Forwards;With walker Number of Stairs: 1 General stair comments: with spouse present performed one step fotrward with walker and 25% VC's on proper tech  Wheelchair Mobility    Modified Rankin (Stroke Patients Only)       Balance                                    Cognition Arousal/Alertness: Awake/alert Behavior During Therapy: WFL for tasks assessed/performed Overall Cognitive Status: Within Functional Limits for tasks assessed                      Exercises   10 reps AP   10 knee presses    5 reps towel squeezes Had to stop due to increased pain and emotional status Applied ICE    General Comments        Pertinent Vitals/Pain Pain Assessment: 0-10 Pain Score: 10-Worst pain ever Pain Location: 6/10 with amb then 10/10 with TE's Pain Intervention(s): Monitored during session;Premedicated before session;Repositioned;Ice applied    Home Living                      Prior Function            PT Goals (current goals can now be found  in the care plan section) Progress towards PT goals: Progressing toward goals    Frequency  7X/week    PT Plan Current plan remains appropriate    Co-evaluation             End of Session Equipment Utilized During Treatment: Gait belt;Left knee immobilizer Activity Tolerance: Patient tolerated treatment well Patient left: in chair;with call bell/phone within reach;with family/visitor present     Time: 1105-1130 PT Time Calculation (min) (ACUTE ONLY): 25 min  Charges:  $Gait Training: 8-22 mins $Therapeutic Exercise: 8-22 mins                    G Codes:      Felecia ShellingLori Khristi Schiller  PTA [facility]  Acute  Rehab Pager      [phone]

## 2016-03-06 DIAGNOSIS — M1991 Primary osteoarthritis, unspecified site: Secondary | ICD-10-CM | POA: Diagnosis not present

## 2016-03-06 DIAGNOSIS — Z96652 Presence of left artificial knee joint: Secondary | ICD-10-CM | POA: Diagnosis not present

## 2016-03-06 DIAGNOSIS — Z7901 Long term (current) use of anticoagulants: Secondary | ICD-10-CM | POA: Diagnosis not present

## 2016-03-06 DIAGNOSIS — G5602 Carpal tunnel syndrome, left upper limb: Secondary | ICD-10-CM | POA: Diagnosis not present

## 2016-03-06 DIAGNOSIS — Z79891 Long term (current) use of opiate analgesic: Secondary | ICD-10-CM | POA: Diagnosis not present

## 2016-03-06 DIAGNOSIS — Z471 Aftercare following joint replacement surgery: Secondary | ICD-10-CM | POA: Diagnosis not present

## 2016-03-06 DIAGNOSIS — M545 Low back pain: Secondary | ICD-10-CM | POA: Diagnosis not present

## 2016-03-06 LAB — TYPE AND SCREEN
ABO/RH(D): B POS
Antibody Screen: NEGATIVE

## 2016-03-07 DIAGNOSIS — Z471 Aftercare following joint replacement surgery: Secondary | ICD-10-CM | POA: Diagnosis not present

## 2016-03-07 DIAGNOSIS — Z7901 Long term (current) use of anticoagulants: Secondary | ICD-10-CM | POA: Diagnosis not present

## 2016-03-07 DIAGNOSIS — M1991 Primary osteoarthritis, unspecified site: Secondary | ICD-10-CM | POA: Diagnosis not present

## 2016-03-07 DIAGNOSIS — G5602 Carpal tunnel syndrome, left upper limb: Secondary | ICD-10-CM | POA: Diagnosis not present

## 2016-03-07 DIAGNOSIS — Z79891 Long term (current) use of opiate analgesic: Secondary | ICD-10-CM | POA: Diagnosis not present

## 2016-03-07 DIAGNOSIS — Z96652 Presence of left artificial knee joint: Secondary | ICD-10-CM | POA: Diagnosis not present

## 2016-03-07 DIAGNOSIS — M545 Low back pain: Secondary | ICD-10-CM | POA: Diagnosis not present

## 2016-03-10 DIAGNOSIS — G5602 Carpal tunnel syndrome, left upper limb: Secondary | ICD-10-CM | POA: Diagnosis not present

## 2016-03-10 DIAGNOSIS — M1991 Primary osteoarthritis, unspecified site: Secondary | ICD-10-CM | POA: Diagnosis not present

## 2016-03-10 DIAGNOSIS — Z7901 Long term (current) use of anticoagulants: Secondary | ICD-10-CM | POA: Diagnosis not present

## 2016-03-10 DIAGNOSIS — Z96652 Presence of left artificial knee joint: Secondary | ICD-10-CM | POA: Diagnosis not present

## 2016-03-10 DIAGNOSIS — Z79891 Long term (current) use of opiate analgesic: Secondary | ICD-10-CM | POA: Diagnosis not present

## 2016-03-10 DIAGNOSIS — Z471 Aftercare following joint replacement surgery: Secondary | ICD-10-CM | POA: Diagnosis not present

## 2016-03-10 DIAGNOSIS — M545 Low back pain: Secondary | ICD-10-CM | POA: Diagnosis not present

## 2016-03-12 DIAGNOSIS — Z79891 Long term (current) use of opiate analgesic: Secondary | ICD-10-CM | POA: Diagnosis not present

## 2016-03-12 DIAGNOSIS — Z7901 Long term (current) use of anticoagulants: Secondary | ICD-10-CM | POA: Diagnosis not present

## 2016-03-12 DIAGNOSIS — G5602 Carpal tunnel syndrome, left upper limb: Secondary | ICD-10-CM | POA: Diagnosis not present

## 2016-03-12 DIAGNOSIS — Z471 Aftercare following joint replacement surgery: Secondary | ICD-10-CM | POA: Diagnosis not present

## 2016-03-12 DIAGNOSIS — M545 Low back pain: Secondary | ICD-10-CM | POA: Diagnosis not present

## 2016-03-12 DIAGNOSIS — Z96652 Presence of left artificial knee joint: Secondary | ICD-10-CM | POA: Diagnosis not present

## 2016-03-12 DIAGNOSIS — M1991 Primary osteoarthritis, unspecified site: Secondary | ICD-10-CM | POA: Diagnosis not present

## 2016-03-13 DIAGNOSIS — Z7901 Long term (current) use of anticoagulants: Secondary | ICD-10-CM | POA: Diagnosis not present

## 2016-03-13 DIAGNOSIS — M545 Low back pain: Secondary | ICD-10-CM | POA: Diagnosis not present

## 2016-03-13 DIAGNOSIS — Z96652 Presence of left artificial knee joint: Secondary | ICD-10-CM | POA: Diagnosis not present

## 2016-03-13 DIAGNOSIS — G5602 Carpal tunnel syndrome, left upper limb: Secondary | ICD-10-CM | POA: Diagnosis not present

## 2016-03-13 DIAGNOSIS — M1991 Primary osteoarthritis, unspecified site: Secondary | ICD-10-CM | POA: Diagnosis not present

## 2016-03-13 DIAGNOSIS — Z471 Aftercare following joint replacement surgery: Secondary | ICD-10-CM | POA: Diagnosis not present

## 2016-03-13 DIAGNOSIS — Z79891 Long term (current) use of opiate analgesic: Secondary | ICD-10-CM | POA: Diagnosis not present

## 2016-03-17 DIAGNOSIS — M545 Low back pain: Secondary | ICD-10-CM | POA: Diagnosis not present

## 2016-03-17 DIAGNOSIS — Z7901 Long term (current) use of anticoagulants: Secondary | ICD-10-CM | POA: Diagnosis not present

## 2016-03-17 DIAGNOSIS — Z79891 Long term (current) use of opiate analgesic: Secondary | ICD-10-CM | POA: Diagnosis not present

## 2016-03-17 DIAGNOSIS — M1991 Primary osteoarthritis, unspecified site: Secondary | ICD-10-CM | POA: Diagnosis not present

## 2016-03-17 DIAGNOSIS — G5602 Carpal tunnel syndrome, left upper limb: Secondary | ICD-10-CM | POA: Diagnosis not present

## 2016-03-17 DIAGNOSIS — Z96652 Presence of left artificial knee joint: Secondary | ICD-10-CM | POA: Diagnosis not present

## 2016-03-17 DIAGNOSIS — Z471 Aftercare following joint replacement surgery: Secondary | ICD-10-CM | POA: Diagnosis not present

## 2016-03-19 DIAGNOSIS — Z471 Aftercare following joint replacement surgery: Secondary | ICD-10-CM | POA: Diagnosis not present

## 2016-03-19 DIAGNOSIS — G5602 Carpal tunnel syndrome, left upper limb: Secondary | ICD-10-CM | POA: Diagnosis not present

## 2016-03-19 DIAGNOSIS — Z7901 Long term (current) use of anticoagulants: Secondary | ICD-10-CM | POA: Diagnosis not present

## 2016-03-19 DIAGNOSIS — Z79891 Long term (current) use of opiate analgesic: Secondary | ICD-10-CM | POA: Diagnosis not present

## 2016-03-19 DIAGNOSIS — Z96652 Presence of left artificial knee joint: Secondary | ICD-10-CM | POA: Diagnosis not present

## 2016-03-19 DIAGNOSIS — M545 Low back pain: Secondary | ICD-10-CM | POA: Diagnosis not present

## 2016-03-19 DIAGNOSIS — M1991 Primary osteoarthritis, unspecified site: Secondary | ICD-10-CM | POA: Diagnosis not present

## 2016-03-20 DIAGNOSIS — G5602 Carpal tunnel syndrome, left upper limb: Secondary | ICD-10-CM | POA: Diagnosis not present

## 2016-03-20 DIAGNOSIS — M1991 Primary osteoarthritis, unspecified site: Secondary | ICD-10-CM | POA: Diagnosis not present

## 2016-03-20 DIAGNOSIS — M545 Low back pain: Secondary | ICD-10-CM | POA: Diagnosis not present

## 2016-03-20 DIAGNOSIS — Z471 Aftercare following joint replacement surgery: Secondary | ICD-10-CM | POA: Diagnosis not present

## 2016-03-20 DIAGNOSIS — Z96652 Presence of left artificial knee joint: Secondary | ICD-10-CM | POA: Diagnosis not present

## 2016-03-20 DIAGNOSIS — Z7901 Long term (current) use of anticoagulants: Secondary | ICD-10-CM | POA: Diagnosis not present

## 2016-03-20 DIAGNOSIS — Z79891 Long term (current) use of opiate analgesic: Secondary | ICD-10-CM | POA: Diagnosis not present

## 2016-03-24 DIAGNOSIS — M25662 Stiffness of left knee, not elsewhere classified: Secondary | ICD-10-CM | POA: Diagnosis not present

## 2016-03-27 DIAGNOSIS — M25662 Stiffness of left knee, not elsewhere classified: Secondary | ICD-10-CM | POA: Diagnosis not present

## 2016-03-31 DIAGNOSIS — M25662 Stiffness of left knee, not elsewhere classified: Secondary | ICD-10-CM | POA: Diagnosis not present

## 2016-04-03 DIAGNOSIS — M25662 Stiffness of left knee, not elsewhere classified: Secondary | ICD-10-CM | POA: Diagnosis not present

## 2016-04-07 DIAGNOSIS — M25662 Stiffness of left knee, not elsewhere classified: Secondary | ICD-10-CM | POA: Diagnosis not present

## 2016-04-08 DIAGNOSIS — Z471 Aftercare following joint replacement surgery: Secondary | ICD-10-CM | POA: Diagnosis not present

## 2016-04-08 DIAGNOSIS — Z96652 Presence of left artificial knee joint: Secondary | ICD-10-CM | POA: Diagnosis not present

## 2016-04-10 DIAGNOSIS — M25662 Stiffness of left knee, not elsewhere classified: Secondary | ICD-10-CM | POA: Diagnosis not present

## 2016-04-14 DIAGNOSIS — M25662 Stiffness of left knee, not elsewhere classified: Secondary | ICD-10-CM | POA: Diagnosis not present

## 2016-04-17 DIAGNOSIS — M25662 Stiffness of left knee, not elsewhere classified: Secondary | ICD-10-CM | POA: Diagnosis not present

## 2016-04-21 DIAGNOSIS — Z01818 Encounter for other preprocedural examination: Secondary | ICD-10-CM | POA: Diagnosis not present

## 2016-04-21 DIAGNOSIS — Z1211 Encounter for screening for malignant neoplasm of colon: Secondary | ICD-10-CM | POA: Diagnosis not present

## 2016-04-21 DIAGNOSIS — M25662 Stiffness of left knee, not elsewhere classified: Secondary | ICD-10-CM | POA: Diagnosis not present

## 2016-04-24 DIAGNOSIS — M25662 Stiffness of left knee, not elsewhere classified: Secondary | ICD-10-CM | POA: Diagnosis not present

## 2016-06-26 DIAGNOSIS — Z1211 Encounter for screening for malignant neoplasm of colon: Secondary | ICD-10-CM | POA: Diagnosis not present

## 2016-06-26 LAB — HM COLONOSCOPY

## 2016-07-24 DIAGNOSIS — L821 Other seborrheic keratosis: Secondary | ICD-10-CM | POA: Diagnosis not present

## 2016-07-24 DIAGNOSIS — L814 Other melanin hyperpigmentation: Secondary | ICD-10-CM | POA: Diagnosis not present

## 2016-07-24 DIAGNOSIS — D225 Melanocytic nevi of trunk: Secondary | ICD-10-CM | POA: Diagnosis not present

## 2016-07-25 DIAGNOSIS — Z1231 Encounter for screening mammogram for malignant neoplasm of breast: Secondary | ICD-10-CM | POA: Diagnosis not present

## 2016-08-08 DIAGNOSIS — M791 Myalgia: Secondary | ICD-10-CM | POA: Diagnosis not present

## 2016-08-12 DIAGNOSIS — Z6826 Body mass index (BMI) 26.0-26.9, adult: Secondary | ICD-10-CM | POA: Diagnosis not present

## 2016-08-12 DIAGNOSIS — Z01419 Encounter for gynecological examination (general) (routine) without abnormal findings: Secondary | ICD-10-CM | POA: Diagnosis not present

## 2016-08-26 ENCOUNTER — Encounter: Payer: Self-pay | Admitting: Sports Medicine

## 2016-08-26 ENCOUNTER — Other Ambulatory Visit: Payer: Self-pay | Admitting: *Deleted

## 2016-08-26 ENCOUNTER — Ambulatory Visit (INDEPENDENT_AMBULATORY_CARE_PROVIDER_SITE_OTHER): Payer: BLUE CROSS/BLUE SHIELD | Admitting: Sports Medicine

## 2016-08-26 DIAGNOSIS — M545 Low back pain, unspecified: Secondary | ICD-10-CM

## 2016-08-26 DIAGNOSIS — G8929 Other chronic pain: Secondary | ICD-10-CM | POA: Diagnosis not present

## 2016-08-26 DIAGNOSIS — M1712 Unilateral primary osteoarthritis, left knee: Secondary | ICD-10-CM | POA: Diagnosis not present

## 2016-08-26 NOTE — Progress Notes (Signed)
  Paige Woodard A Balgobin - 55 y.o. female MRN [id number]  Date of birth: July 09, 1961  SUBJECTIVE:  Including CC & ROS.  CC: Right hip and leg pain  Paige Woodard is a 55yo female with hx of left total knee arthroplasty 5 months ago presenting today for right hip pain that radiates into her anterior right leg onset 5 weeks ago but worsening over the past 10 days. Pt does not report actual mechanism. Pt reports that she has tried meloxicam without relief of symptoms. Icing her hip with alternating heat pads provides minor relief. Symptoms are worsened by adduction of her right leg. She denies trouble ambulating.   ROS: No incontinence No weakness No injury/trauma No numbness   HISTORY: Past Medical, Surgical, Social, and Family History Reviewed & Updated per EMR.   Pertinent Historical Findings include: PMSHx - total knee arthoplasty 02/2016/ arthroscopy 09/2015 with no improvement PSHx -  Non-smoker  Medications - meloxicam   DATA REVIEWED:   PHYSICAL EXAM:  VS: BP:(!) 143/92  HR:68bpm  TEMP: ( )  RESP:   HT:5\' 3"  (160 cm)   WT:135 lb (61.2 kg)  BMI:24 PHYSICAL EXAM: Gen: NAD, alert, cooperative with exam, well-appearing Skin: no rashes, normal turgor  Neuro: no gross deficits.  Psych:  alert and oriented  Right hip:  Alignment of right hip and pelvis shows the RT ASIS is anteriorly rotated  RT SIJ moves poorly Neg SLR FABER is tight  Nl ROM of hip joints Strength with resisted abduction and adduction nl  Nl strength with flexion and extension Nl dorsiflexion and plantar flexion  Sensation intact throughout right leg  Gait: No genu valgus or genu varus   Left knee looks very good with excellent flexin and extension  No limp with walking   ASSESSMENT & PLAN:  SI joint dysfunction:  -Recommend at home exercises to realign Pelvis and SIJ -Continue using anti-inflammatory as needed for symptom relief -If symptoms do not improve within the next 4-6 weeks return and would  consider adding flexeril

## 2016-08-26 NOTE — Assessment & Plan Note (Signed)
Currently with SI joint dysfunction on RT  Will add stretches and HEP  If this does not relieve consider adding flex or amitriptyline at hs

## 2016-08-26 NOTE — Assessment & Plan Note (Signed)
Has done very well post TKR of left knee June 2017 Back playing tennis

## 2016-09-08 DIAGNOSIS — H0289 Other specified disorders of eyelid: Secondary | ICD-10-CM | POA: Diagnosis not present

## 2016-09-08 DIAGNOSIS — H524 Presbyopia: Secondary | ICD-10-CM | POA: Diagnosis not present

## 2016-09-08 DIAGNOSIS — H25013 Cortical age-related cataract, bilateral: Secondary | ICD-10-CM | POA: Diagnosis not present

## 2016-09-08 DIAGNOSIS — H2513 Age-related nuclear cataract, bilateral: Secondary | ICD-10-CM | POA: Diagnosis not present

## 2017-02-19 DIAGNOSIS — L237 Allergic contact dermatitis due to plants, except food: Secondary | ICD-10-CM | POA: Diagnosis not present

## 2017-07-28 DIAGNOSIS — D1800 Hemangioma unspecified site: Secondary | ICD-10-CM | POA: Diagnosis not present

## 2017-07-28 DIAGNOSIS — L821 Other seborrheic keratosis: Secondary | ICD-10-CM | POA: Diagnosis not present

## 2017-07-28 DIAGNOSIS — D225 Melanocytic nevi of trunk: Secondary | ICD-10-CM | POA: Diagnosis not present

## 2017-07-28 DIAGNOSIS — L814 Other melanin hyperpigmentation: Secondary | ICD-10-CM | POA: Diagnosis not present

## 2017-10-12 DIAGNOSIS — Z01419 Encounter for gynecological examination (general) (routine) without abnormal findings: Secondary | ICD-10-CM | POA: Diagnosis not present

## 2017-10-12 DIAGNOSIS — Z6827 Body mass index (BMI) 27.0-27.9, adult: Secondary | ICD-10-CM | POA: Diagnosis not present

## 2017-10-12 DIAGNOSIS — Z1231 Encounter for screening mammogram for malignant neoplasm of breast: Secondary | ICD-10-CM | POA: Diagnosis not present

## 2017-10-22 DIAGNOSIS — Z13820 Encounter for screening for osteoporosis: Secondary | ICD-10-CM | POA: Diagnosis not present

## 2018-10-29 DIAGNOSIS — Z1231 Encounter for screening mammogram for malignant neoplasm of breast: Secondary | ICD-10-CM | POA: Diagnosis not present

## 2018-10-29 DIAGNOSIS — Z6827 Body mass index (BMI) 27.0-27.9, adult: Secondary | ICD-10-CM | POA: Diagnosis not present

## 2018-10-29 DIAGNOSIS — Z01419 Encounter for gynecological examination (general) (routine) without abnormal findings: Secondary | ICD-10-CM | POA: Diagnosis not present

## 2018-10-29 DIAGNOSIS — Z1212 Encounter for screening for malignant neoplasm of rectum: Secondary | ICD-10-CM | POA: Diagnosis not present

## 2018-11-26 DIAGNOSIS — R509 Fever, unspecified: Secondary | ICD-10-CM | POA: Diagnosis not present

## 2018-11-26 DIAGNOSIS — R52 Pain, unspecified: Secondary | ICD-10-CM | POA: Diagnosis not present

## 2018-11-26 DIAGNOSIS — R0989 Other specified symptoms and signs involving the circulatory and respiratory systems: Secondary | ICD-10-CM | POA: Diagnosis not present

## 2019-04-18 DIAGNOSIS — Z7189 Other specified counseling: Secondary | ICD-10-CM | POA: Diagnosis not present

## 2019-08-08 DIAGNOSIS — L821 Other seborrheic keratosis: Secondary | ICD-10-CM | POA: Diagnosis not present

## 2019-08-08 DIAGNOSIS — L814 Other melanin hyperpigmentation: Secondary | ICD-10-CM | POA: Diagnosis not present

## 2019-08-08 DIAGNOSIS — D225 Melanocytic nevi of trunk: Secondary | ICD-10-CM | POA: Diagnosis not present

## 2019-12-02 DIAGNOSIS — Z7189 Other specified counseling: Secondary | ICD-10-CM | POA: Diagnosis not present

## 2019-12-02 DIAGNOSIS — Z20828 Contact with and (suspected) exposure to other viral communicable diseases: Secondary | ICD-10-CM | POA: Diagnosis not present

## 2020-02-21 DIAGNOSIS — Z6827 Body mass index (BMI) 27.0-27.9, adult: Secondary | ICD-10-CM | POA: Diagnosis not present

## 2020-02-21 DIAGNOSIS — Z1231 Encounter for screening mammogram for malignant neoplasm of breast: Secondary | ICD-10-CM | POA: Diagnosis not present

## 2020-02-21 DIAGNOSIS — Z01419 Encounter for gynecological examination (general) (routine) without abnormal findings: Secondary | ICD-10-CM | POA: Diagnosis not present

## 2020-03-01 DIAGNOSIS — H43391 Other vitreous opacities, right eye: Secondary | ICD-10-CM | POA: Diagnosis not present

## 2020-03-01 DIAGNOSIS — H43811 Vitreous degeneration, right eye: Secondary | ICD-10-CM | POA: Diagnosis not present

## 2020-03-01 DIAGNOSIS — H2513 Age-related nuclear cataract, bilateral: Secondary | ICD-10-CM | POA: Diagnosis not present

## 2020-03-01 DIAGNOSIS — H524 Presbyopia: Secondary | ICD-10-CM | POA: Diagnosis not present

## 2020-08-15 DIAGNOSIS — L578 Other skin changes due to chronic exposure to nonionizing radiation: Secondary | ICD-10-CM | POA: Diagnosis not present

## 2020-08-15 DIAGNOSIS — D225 Melanocytic nevi of trunk: Secondary | ICD-10-CM | POA: Diagnosis not present

## 2020-08-15 DIAGNOSIS — L814 Other melanin hyperpigmentation: Secondary | ICD-10-CM | POA: Diagnosis not present

## 2020-08-15 DIAGNOSIS — L821 Other seborrheic keratosis: Secondary | ICD-10-CM | POA: Diagnosis not present

## 2021-02-27 DIAGNOSIS — R635 Abnormal weight gain: Secondary | ICD-10-CM | POA: Diagnosis not present

## 2021-02-27 DIAGNOSIS — Z1231 Encounter for screening mammogram for malignant neoplasm of breast: Secondary | ICD-10-CM | POA: Diagnosis not present

## 2021-02-27 DIAGNOSIS — Z6827 Body mass index (BMI) 27.0-27.9, adult: Secondary | ICD-10-CM | POA: Diagnosis not present

## 2021-02-27 DIAGNOSIS — Z13820 Encounter for screening for osteoporosis: Secondary | ICD-10-CM | POA: Diagnosis not present

## 2021-02-27 DIAGNOSIS — Z01419 Encounter for gynecological examination (general) (routine) without abnormal findings: Secondary | ICD-10-CM | POA: Diagnosis not present

## 2021-02-27 LAB — CBC AND DIFFERENTIAL
HCT: 43 (ref 36–46)
Hemoglobin: 14 (ref 12.0–16.0)
Platelets: 236 10*3/uL (ref 150–400)
WBC: 3.1

## 2021-02-27 LAB — CBC: RBC: 4.54 (ref 3.87–5.11)

## 2021-02-27 LAB — HM DEXA SCAN: HM Dexa Scan: NORMAL

## 2021-03-21 DIAGNOSIS — L237 Allergic contact dermatitis due to plants, except food: Secondary | ICD-10-CM | POA: Diagnosis not present

## 2021-05-24 DIAGNOSIS — M79604 Pain in right leg: Secondary | ICD-10-CM | POA: Diagnosis not present

## 2021-08-15 DIAGNOSIS — D225 Melanocytic nevi of trunk: Secondary | ICD-10-CM | POA: Diagnosis not present

## 2021-08-15 DIAGNOSIS — L821 Other seborrheic keratosis: Secondary | ICD-10-CM | POA: Diagnosis not present

## 2021-08-15 DIAGNOSIS — L814 Other melanin hyperpigmentation: Secondary | ICD-10-CM | POA: Diagnosis not present

## 2021-08-15 DIAGNOSIS — L578 Other skin changes due to chronic exposure to nonionizing radiation: Secondary | ICD-10-CM | POA: Diagnosis not present

## 2021-09-20 DIAGNOSIS — U071 COVID-19: Secondary | ICD-10-CM | POA: Diagnosis not present

## 2022-02-27 LAB — HM PAP SMEAR: HM Pap smear: NEGATIVE

## 2022-04-09 DIAGNOSIS — Z01419 Encounter for gynecological examination (general) (routine) without abnormal findings: Secondary | ICD-10-CM | POA: Diagnosis not present

## 2022-04-09 DIAGNOSIS — Z6826 Body mass index (BMI) 26.0-26.9, adult: Secondary | ICD-10-CM | POA: Diagnosis not present

## 2022-04-09 DIAGNOSIS — Z1231 Encounter for screening mammogram for malignant neoplasm of breast: Secondary | ICD-10-CM | POA: Diagnosis not present

## 2022-04-09 LAB — HM MAMMOGRAPHY

## 2022-06-04 ENCOUNTER — Encounter: Payer: Self-pay | Admitting: Family Medicine

## 2022-06-04 ENCOUNTER — Ambulatory Visit (INDEPENDENT_AMBULATORY_CARE_PROVIDER_SITE_OTHER): Payer: BLUE CROSS/BLUE SHIELD | Admitting: Family Medicine

## 2022-06-04 VITALS — BP 136/80 | HR 75 | Temp 98.0°F | Ht 62.0 in | Wt 142.2 lb

## 2022-06-04 DIAGNOSIS — R053 Chronic cough: Secondary | ICD-10-CM

## 2022-06-04 DIAGNOSIS — N951 Menopausal and female climacteric states: Secondary | ICD-10-CM

## 2022-06-04 DIAGNOSIS — Z23 Encounter for immunization: Secondary | ICD-10-CM

## 2022-06-04 MED ORDER — GABAPENTIN 100 MG PO CAPS: 100.0000 mg | ORAL_CAPSULE | Freq: Three times a day (TID) | ORAL | 11 refills | Status: DC

## 2022-06-04 NOTE — Progress Notes (Signed)
New Patient Office Visit  Subjective:  Patient ID: Paige Woodard, female    DOB: August 14, 1961  Age: 61 y.o. MRN: [id number]  CC:  Chief Complaint  Patient presents with   Establish Care    Need new pcp    Cough    Started a couple of weeks    HPI Paige Woodard presents for new pt Longevity in family  Cough-told scar tissue in lungs from childhood.  May have had covid in early 2020 and then cough that wouldn't resolve for sev wks then resolved.   Occ feels "congestion" and then will cough.  Sensitive to smells and can cough.    About 2 wks ago, felt some pressure in chest and cough and resolved.  Was at rest .   Has anxiety as well.   Then smells last pm and coughed and then ok.  No heartburn.  No more chest pain.  No sob.    Bunnies trigger as well.    This can occ back to back or nothing for months.  Some Anxiety-worries.  No SI  hard to fall asleep at times.  Benadryl helps.  Melatonin not work.    Mamm and pap March 2023-Dr. Grewal.  2022-labs and dxa  Past Medical History:  Diagnosis Date   Allergy 10/17/1989   Anxiety 2020   Arthritis    Neuromuscular disorder (HCC)    carpal tunnel / Bil   No pertinent past medical history     Past Surgical History:  Procedure Laterality Date   CARPAL TUNNEL RELEASE  11/05/2011   Procedure: CARPAL TUNNEL RELEASE;  Surgeon: Harvie Junior, MD;  Location: [facility];  Service: Orthopedics;  Laterality: Right;   DILATION AND CURETTAGE OF UTERUS     JOINT REPLACEMENT  03/03/2016   TONSILLECTOMY     TOTAL KNEE ARTHROPLASTY Left 03/03/2016   Procedure: LEFT TOTAL KNEE ARTHROPLASTY;  Surgeon: Ollen Gross, MD;  Location: [facility];  Service: Orthopedics;  Laterality: Left;    Family History  Problem Relation Age of Onset   Arthritis Mother    Arthritis Brother    Alcohol abuse Maternal Grandfather     Social History   Socioeconomic History   Marital status: Married    Spouse name: Not on file   Number of  children: 2   Years of education: Not on file   Highest education level: Not on file  Occupational History   Not on file  Tobacco Use   Smoking status: Never   Smokeless tobacco: Never  Vaping Use   Vaping Use: Never used  Substance and Sexual Activity   Alcohol use: Yes    Alcohol/week: 10.0 standard drinks of alcohol    Types: 10 Glasses of wine per week    Comment: occ   Drug use: No   Sexual activity: Yes    Birth control/protection: None  Other Topics Concern   Not on file  Social History Narrative   [employer]ail [employer]   Social Determinants of Health   Financial Resource Strain: Not on file  Food Insecurity: Not on file  Transportation Needs: Not on file  Physical Activity: Not on file  Stress: Not on file  Social Connections: Not on file  Intimate Partner Violence: Not on file    ROS  ROS: Gen: no fever, chills  Skin: no rash, itching ENT: no ear pain, ear drainage, nasal congestion, rhinorrhea, sinus pressure, sore throat Eyes: no blurry vision, double vision Resp: HPI  CV: no CP, palpitations, LE edema,  GI: no heartburn, n/v/d/c, abd pain GU: no dysuria, urgency, frequency, hematuria.  Still w/hot flashes.  Some nerve pain and occ bad hot flash and pain both thighs.   MSK: no joint pain, myalgias, back pain Neuro: no dizziness, headache, weakness, vertigo Psych: no depression, anxiety, insomnia, SI   Objective:   Today's Vitals: BP 136/80   Pulse 75   Temp 98 F (36.7 C) (Temporal)   Ht 5\' 2"  (1.575 m)   Wt 142 lb 4 oz (64.5 kg)   SpO2 98%   BMI 26.02 kg/m   Physical Exam  Gen: WDWN NAD wf HEENT: NCAT, conjunctiva not injected, sclera nonicteric TM WNL B, OP moist, no exudates  NECK:  supple, no thyromegaly, no nodes, no carotid bruits CARDIAC: RRR, S1S2+, no murmur. DP 2+B LUNGS: CTAB. No wheezes ABDOMEN:  BS+, soft, NTND, No HSM, no masses EXT:  no edema MSK: no gross abnormalities.  NEURO: A&O x3.  CN II-XII intact.  PSYCH: normal mood.  Good eye contact   Assessment & Plan:   Problem List Items Addressed This Visit   None Visit Diagnoses     Chronic cough    -  Primary   Hot flash, menopausal          Chronic cough-seems to be more allergen related.  She states that her last chest x-ray showed scar tissue with no change on repeat.  Discussed during another chest x-ray, however, upon discussing pros/cons/potential chest CT-patient decided against it.  She will continue to monitor. Hot flashes-still getting some hot flashes, but frequently at night.  She does have some chronic insomnia issues.  Also mild neuropathy at night.  Discussed using gabapentin 100 mg nightly.  (She did have problems with that in the past but she was also taking several other meds at the same time).  She is willing to give it a try.  We may need a higher dose as well.  Let know progress Follow-up annually for physical, and as needed.  States c scope thru [facility](she thinks) and <52yrs ago.   Outpatient Encounter Medications as of 06/04/2022  Medication Sig   gabapentin (NEURONTIN) 100 MG capsule Take 1 capsule (100 mg total) by mouth 3 (three) times daily.   ibuprofen (ADVIL,MOTRIN) 200 MG tablet Take 200 mg by mouth every 8 (eight) hours as needed.   No facility-administered encounter medications on file as of 06/04/2022.    Follow-up: No follow-ups on file.   08/04/2022, MD

## 2022-06-04 NOTE — Patient Instructions (Signed)
Welcome to [facility]! It was a pleasure meeting you today.  As discussed, Please schedule a 12 month follow up visit today.  Let me know how gabapentin works.     PLEASE NOTE:  If you had any LAB tests please let us know if you have not heard back within a few days. You may see your results on MyChart before we have a chance to review them but we will give you a call once they are reviewed by [place]. If we ordered any REFERRALS today, please let us know if you have not heard from their office within the next week.  Let us know through MyChart if you are needing REFILLS, or have your pharmacy send [place] the request. You can also use MyChart to communicate with me or any office staff.  Please try these tips to maintain a healthy lifestyle:  Eat most of your calories during the day when you are active. Eliminate processed foods including packaged sweets (pies, cakes, cookies), reduce intake of potatoes, white bread, white pasta, and white rice. Look for whole grain options, oat flour or almond flour.  Each meal should contain half fruits/vegetables, one quarter protein, and one quarter carbs (no bigger than a computer mouse).  Cut down on sweet beverages. This includes juice, soda, and sweet tea. Also watch fruit intake, though this is a healthier sweet option, it still contains natural sugar! Limit to 3 servings daily.  Drink at least 1 glass of water with each meal and aim for at least 8 glasses per day  Exercise at least 150 minutes every week.

## 2022-06-12 ENCOUNTER — Encounter: Payer: Self-pay | Admitting: Family Medicine

## 2022-06-12 ENCOUNTER — Encounter: Payer: Self-pay | Admitting: *Deleted

## 2022-07-29 ENCOUNTER — Ambulatory Visit: Payer: Self-pay

## 2022-07-29 ENCOUNTER — Ambulatory Visit (INDEPENDENT_AMBULATORY_CARE_PROVIDER_SITE_OTHER): Payer: BC Managed Care – PPO | Admitting: Sports Medicine

## 2022-07-29 VITALS — BP 148/98 | Ht 62.0 in | Wt 130.0 lb

## 2022-07-29 DIAGNOSIS — M25512 Pain in left shoulder: Secondary | ICD-10-CM | POA: Insufficient documentation

## 2022-07-29 MED ORDER — NITROGLYCERIN 0.2 MG/HR TD PT24: MEDICATED_PATCH | TRANSDERMAL | 1 refills | Status: DC

## 2022-07-29 NOTE — Patient Instructions (Addendum)

## 2022-07-29 NOTE — Progress Notes (Signed)
   Established Patient Office Visit  Subjective   Patient ID: Paige Woodard, female    DOB: August 16, 1961  Age: 61 y.o. MRN: [id number]  Left shoulder pain  Mrs Marolyn Hammock presents today with chief complaint of left shoulder pain for the past 3 months after tugging to start the leaf blower.   Minimal pain with tennis.  ROS as listed above in HPI    Objective:     BP (!) 148/98   Ht 5\' 2"  (1.575 m)   Wt 130 lb (59 kg)   BMI 23.78 kg/m   Physical Exam Vitals reviewed.  Constitutional:      General: She is not in acute distress.    Appearance: Normal appearance. She is not ill-appearing, toxic-appearing or diaphoretic.  Pulmonary:     Effort: Pulmonary effort is normal.  Neurological:     Mental Status: She is alert.   Left shoulder: No obvious deformity or asymmetry. TTP lateral shoulder. FROM forward flexion, abduction, and external rotation. Decreased ROM internal rotation. Painful arc. Strength 5/5 forward flexion, internal and external rotation. Positive empty can for pain.   MSK Limited US of Shoulder, Left  Patient was seated on exam table and shoulder US examination was performed using high frequency linear probe.   Biceps tendon- visualized within the bicipital groove in both longitudinal and transverse axis with no signs of fluid or hypoechoic changes. Tendon fibers intact without signs of irregularity.  Subscapularis tendon- visualized in longitudinal axis with intact tendon inserting at the inferior lesser tubercle of the humerus. Hypoechoic changes and calcifications within tendon visualized.  Supraspinatus tendon- visualized in longitudinal and dynamic views. No signs of tear with the tendon inserting at the superior facet of the greater tubercle of the humerus, some distal hypoechoic change and linear calcification Infraspinatus and teres minor tendons- visualized in both longitudinal and transverse axis with tendon insertion at the middle facet of the great  tubercle of the humerus with no signs of tearing, hypoechoic changes or tissue irregularity seen.  Posterior glenohumeral joint- visualized with proper alignment.  AC joint- visualized with small amount of bursal distension and significant bony calcification.   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 And current swelling in supraspinatus tendon.   Ultrasound and interpretation by Dr. Arlis Porta and Wolfgang Phoenix. Fields, MD    Assessment & Plan:   Problem List Items Addressed This Visit       Other   Acute pain of left shoulder - Primary    Clinical and radiographic evidence of subscapularis tendinopathy, likely supraspinatus involvement as well Will start NT protocol and rehabilitation exercises She may continue to participate in tennis as tolerated Recommend cushion grip, decreased string tension in 40s, she may want to begin at 48 to adjust       Relevant Orders   [place] COMPLETE JOINT SPACE STRUCTURE UP LEFT    Return in about 6 weeks (around 09/09/2022).    Elmore Guise, DO  I observed and examined the patient with the [facility] resident and agree with assessment and plan.  Note reviewed and modified by me. Ila Mcgill, MD

## 2022-07-29 NOTE — Assessment & Plan Note (Signed)
Clinical and radiographic evidence of subscapularis tendinopathy, likely supraspinatus involvement as well Will start NT protocol and rehabilitation exercises She may continue to participate in tennis as tolerated Recommend cushion grip, decreased string tension in 40s, she may want to begin at 48 to adjust

## 2022-09-02 DIAGNOSIS — L814 Other melanin hyperpigmentation: Secondary | ICD-10-CM | POA: Diagnosis not present

## 2022-09-02 DIAGNOSIS — D225 Melanocytic nevi of trunk: Secondary | ICD-10-CM | POA: Diagnosis not present

## 2022-09-02 DIAGNOSIS — L821 Other seborrheic keratosis: Secondary | ICD-10-CM | POA: Diagnosis not present

## 2022-09-02 DIAGNOSIS — L578 Other skin changes due to chronic exposure to nonionizing radiation: Secondary | ICD-10-CM | POA: Diagnosis not present

## 2022-09-09 ENCOUNTER — Ambulatory Visit: Payer: BC Managed Care – PPO | Admitting: Sports Medicine

## 2022-09-09 ENCOUNTER — Ambulatory Visit: Payer: Self-pay

## 2022-09-09 VITALS — BP 136/88 | Ht 62.0 in | Wt 130.0 lb

## 2022-09-09 DIAGNOSIS — M25512 Pain in left shoulder: Secondary | ICD-10-CM

## 2022-09-09 DIAGNOSIS — M25571 Pain in right ankle and joints of right foot: Secondary | ICD-10-CM

## 2022-09-09 NOTE — Progress Notes (Signed)
   Established Patient Office Visit  Subjective   Patient ID: Paige Woodard, female    DOB: August 06, 1961  Age: 61 y.o. MRN: [id number]  Follow-up left shoulder, acute right foot pain.  Paige Woodard is here today for follow-up of left shoulder tendinopathy.  She has been performing home exercise program with resistance bands.  She did discontinue the nitroglycerin as she was having some increased headaches.  She modified her tennis racquet strings to a lower string tension which she states has helped her a lot.  She feels very good during tennis.  Of note she is also here today to have her right foot evaluated.  She kicked a door from about 4 weeks ago and has had some pain in her baby toe since then.  Her pain has improved.  She tried taping however that was very uncomfortable for her.  She is able to play tennis on this injury.  She did appreciate some bruising and swelling at the time of the initial incident.   ROS as listed above in HPI    Objective:     BP 136/88   Ht 5\' 2"  (1.575 m)   Wt 130 lb (59 kg)   BMI 23.78 kg/m   Physical Exam Vitals reviewed.  Constitutional:      General: She is not in acute distress.    Appearance: Normal appearance. She is not ill-appearing, toxic-appearing or diaphoretic.  Pulmonary:     Effort: Pulmonary effort is normal.  Neurological:     Mental Status: She is alert.   Left shoulder: No obvious deformity or asymmetry.  No tenderness to palpation.  Full range of motion with forward flexion, abduction and external rotation.  Strength 5/5 internal and external rotation.  Some discomfort with resisted forward flexion.  Grip strength 5/5.  Radial pulse 2+. Right foot: No obvious deformity or asymmetry.  No ecchymosis or edema appreciated today.  Tenderness to palpation the fifth phalanx.  No tenderness to palpation at the base of the fifth MT.  Full range of motion with ankle dorsiflexion, plantarflexion inversion and eversion.  Normal gait.  Limited  ultrasound: Right foot Fifth metatarsal, visualized, no cortical abnormality seen Fifth phalanx, visualized, there is a small cortical abnormality at the distal and intermediate phalanx, cap sign seen in short axis Impression: Suspected chip fracture of the fifth phalanx  Ultrasound and interpretation by Dr. and Leonor Liv. Fields, MD     Assessment & Plan:   Problem List Items Addressed This Visit       Other   Acute pain of left shoulder    She continues to improve.  Recommend continuation of home therapy program.  Follow-up as needed.      Pain in joint involving right ankle and foot - Primary    Suspected possible chip fracture of the fifth phalanx.  Recommend over-the-counter Tylenol and NSAIDs for pain relief.  She was shown taping technique for comfort.  Discussed with her that she may have some discomfort over the next 6 months to a year.  Follow-up as needed      Relevant Orders   Sibyl Parr LIMITED JOINT SPACE STRUCTURES LOW RIGHT    Return if symptoms worsen or fail to improve.    [place], DO  I observed and examined the patient with the [facility] resident and agree with assessment and plan.  Note reviewed and modified by me. [facility], MD

## 2022-09-09 NOTE — Assessment & Plan Note (Signed)
She continues to improve.  Recommend continuation of home therapy program.  Follow-up as needed.

## 2022-09-09 NOTE — Assessment & Plan Note (Signed)
Suspected possible chip fracture of the fifth phalanx.  Recommend over-the-counter Tylenol and NSAIDs for pain relief.  She was shown taping technique for comfort.  Discussed with her that she may have some discomfort over the next 6 months to a year.  Follow-up as needed

## 2022-10-16 DIAGNOSIS — H524 Presbyopia: Secondary | ICD-10-CM | POA: Diagnosis not present

## 2022-10-16 DIAGNOSIS — H43811 Vitreous degeneration, right eye: Secondary | ICD-10-CM | POA: Diagnosis not present

## 2022-10-16 DIAGNOSIS — H2513 Age-related nuclear cataract, bilateral: Secondary | ICD-10-CM | POA: Diagnosis not present

## 2022-10-16 DIAGNOSIS — H1045 Other chronic allergic conjunctivitis: Secondary | ICD-10-CM | POA: Diagnosis not present

## 2022-10-16 DIAGNOSIS — H25013 Cortical age-related cataract, bilateral: Secondary | ICD-10-CM | POA: Diagnosis not present

## 2022-12-25 ENCOUNTER — Ambulatory Visit: Payer: BC Managed Care – PPO | Admitting: Sports Medicine

## 2022-12-25 VITALS — BP 112/80 | Ht 62.0 in | Wt 135.0 lb

## 2022-12-25 DIAGNOSIS — M545 Low back pain, unspecified: Secondary | ICD-10-CM

## 2022-12-25 NOTE — Assessment & Plan Note (Addendum)
Patient having lower back pain. Red flag symptom of night time pain over 63 years old. Possible DDD with slipped disk. Could consider foraminal narrowing given patient has pain with side stretching as well. No sign of acute fracture today on exam. -Lumbar XR -Stretching exercises given to patient -Topical treatments recommended

## 2022-12-25 NOTE — Progress Notes (Signed)
    SUBJECTIVE:   CHIEF COMPLAINT / HPI: Low back pain  For the past few months has started noticing more lower back pain She also says that it sometimes goes into her left glute The worse is after sitting for long periods of time and at night time pain She is waking up at 2-3 in the morning due to pain Worst in the morning when waking up Has used an OTC bio-freeze patch on left glute which helped her sleep somewhat more than normal Activity makes it feel better No saddle anesthesia, shocks down leg, sensation differences, leg weakness, bowel/bladder incontinence that she has noticed  PERTINENT  PMH / PSH:   OBJECTIVE:   BP 112/80   Ht 5\' 2"  (1.575 m)   Wt 135 lb (61.2 kg)   BMI 24.69 kg/m   Gen: NAD, awake, alert and responsive  Back: - Inspection: no gross deformity or asymmetry, swelling or ecchymosis - Palpation: no TTP  spinous process, no TTP of paraspinal musculature - ROM: full active ROM of the back with extension, flexion (some tightness with both) - Strength: LE 5/5 b/l with knee flexion/extension; Hip abductors 5/5 b/l - Neuro: sensation intact b/l - Special testing: seated slump negative, straight leg negative  Bilateral hamstring/glutes - Inspection: No obvious abnormalities on inspection - Palpation: Non-tender to palpation along medial, lateral or proximal portions of hamstrings b/l Nontender to palpation of left gluteus max/med - Strength: 5/5 at 90 degree knee flexion and 30 degree knee flexion b/l ; no pain that was elicited or reproduced with this.     ASSESSMENT/PLAN:   Low back pain of over 3 months duration Patient having lower back pain. Red flag symptom of night time pain over 62 years old. Possible DDD with slipped disk. Could consider foraminal narrowing given patient has pain with side stretching as well. No sign of acute fracture today on exam. -Lumbar XR -Stretching exercises given to patient -Topical treatments recommended   Gerrit Heck, MD [place]   I observed and examined the patient with the resident and agree with assessment and plan.  Note reviewed and modified by me. This appears muscular and williams flexion exercises should help.  However, considering night pain and age we will assess XR. Warned of more serious sxs that require more evaluation. Ila Mcgill, mD

## 2022-12-26 ENCOUNTER — Ambulatory Visit
Admission: RE | Admit: 2022-12-26 | Discharge: 2022-12-26 | Disposition: A | Discharge status: Home / Self Care | Payer: BC Managed Care – PPO | Source: Ambulatory Visit | Attending: Sports Medicine | Admitting: Sports Medicine

## 2022-12-26 DIAGNOSIS — M545 Low back pain, unspecified: Secondary | ICD-10-CM

## 2022-12-26 DIAGNOSIS — M47816 Spondylosis without myelopathy or radiculopathy, lumbar region: Secondary | ICD-10-CM | POA: Diagnosis not present

## 2022-12-26 DIAGNOSIS — M48061 Spinal stenosis, lumbar region without neurogenic claudication: Secondary | ICD-10-CM | POA: Diagnosis not present

## 2023-01-05 ENCOUNTER — Other Ambulatory Visit: Payer: Self-pay | Admitting: *Deleted

## 2023-01-05 MED ORDER — GABAPENTIN 300 MG PO CAPS: 300.0000 mg | ORAL_CAPSULE | Freq: Every day | ORAL | 2 refills | Status: DC

## 2023-02-12 DIAGNOSIS — D1801 Hemangioma of skin and subcutaneous tissue: Secondary | ICD-10-CM | POA: Diagnosis not present

## 2023-02-12 DIAGNOSIS — D485 Neoplasm of uncertain behavior of skin: Secondary | ICD-10-CM | POA: Diagnosis not present

## 2023-02-12 DIAGNOSIS — S0006XA Insect bite (nonvenomous) of scalp, initial encounter: Secondary | ICD-10-CM | POA: Diagnosis not present

## 2023-04-18 ENCOUNTER — Other Ambulatory Visit: Payer: Self-pay | Admitting: Sports Medicine

## 2023-04-22 ENCOUNTER — Other Ambulatory Visit: Payer: Self-pay

## 2023-04-22 MED ORDER — GABAPENTIN 300 MG PO CAPS: 300.0000 mg | ORAL_CAPSULE | Freq: Every day | ORAL | 2 refills | Status: DC

## 2023-04-28 DIAGNOSIS — Z1231 Encounter for screening mammogram for malignant neoplasm of breast: Secondary | ICD-10-CM | POA: Diagnosis not present

## 2023-04-28 DIAGNOSIS — Z6827 Body mass index (BMI) 27.0-27.9, adult: Secondary | ICD-10-CM | POA: Diagnosis not present

## 2023-04-28 DIAGNOSIS — Z13820 Encounter for screening for osteoporosis: Secondary | ICD-10-CM | POA: Diagnosis not present

## 2023-04-28 DIAGNOSIS — Z01419 Encounter for gynecological examination (general) (routine) without abnormal findings: Secondary | ICD-10-CM | POA: Diagnosis not present

## 2023-05-14 ENCOUNTER — Encounter (INDEPENDENT_AMBULATORY_CARE_PROVIDER_SITE_OTHER): Payer: Self-pay

## 2023-05-28 ENCOUNTER — Other Ambulatory Visit: Payer: Self-pay

## 2023-05-28 ENCOUNTER — Ambulatory Visit: Payer: BC Managed Care – PPO | Admitting: Sports Medicine

## 2023-05-28 VITALS — BP 134/88 | Ht 62.0 in | Wt 135.0 lb

## 2023-05-28 DIAGNOSIS — G8929 Other chronic pain: Secondary | ICD-10-CM

## 2023-05-28 DIAGNOSIS — S46119A Strain of muscle, fascia and tendon of long head of biceps, unspecified arm, initial encounter: Secondary | ICD-10-CM | POA: Diagnosis not present

## 2023-05-28 DIAGNOSIS — M25512 Pain in left shoulder: Secondary | ICD-10-CM

## 2023-05-28 NOTE — Progress Notes (Addendum)
PCP: Jeani Sow, MD  Subjective:   HPI: Patient is a 62 y.o. female here for evaluation of left shoulder pain.  Previously evaluated 07/29/2022 and thought to be related to rotator cuff tendinopathy.  She was given nitroglycerin protocol and home exercise-pain improved for approximately 3 months. 3 months ago she started noticing increased pain.  Denies any specific injury.  Has remained active.  Aggravating motions include abduction and internal rotation of shoulder.  She has been taking ibuprofen without relief.  Past Medical History:  Diagnosis Date   Allergy 10/17/1989   Anxiety 2020   Arthritis    Neuromuscular disorder (HCC)    carpal tunnel / Bil   No pertinent past medical history     Current Outpatient Medications on File Prior to Visit  Medication Sig Dispense Refill   gabapentin (NEURONTIN) 300 MG capsule Take 1 capsule (300 mg total) by mouth at bedtime. 30 capsule 2   ibuprofen (ADVIL,MOTRIN) 200 MG tablet Take 200 mg by mouth every 8 (eight) hours as needed.     nitroGLYCERIN (NITRODUR - DOSED IN MG/24 HR) 0.2 mg/hr patch Use 1/4 patch daily to the affected area. (Patient not taking: Reported on 09/09/2022) 30 patch 1   No current facility-administered medications on file prior to visit.    Past Surgical History:  Procedure Laterality Date   CARPAL TUNNEL RELEASE  11/05/2011   Procedure: CARPAL TUNNEL RELEASE;  Surgeon: Harvie Junior, MD;  Location: [facility];  Service: Orthopedics;  Laterality: Right;   DILATION AND CURETTAGE OF UTERUS     JOINT REPLACEMENT  03/03/2016   TONSILLECTOMY     TOTAL KNEE ARTHROPLASTY Left 03/03/2016   Procedure: LEFT TOTAL KNEE ARTHROPLASTY;  Surgeon: Ollen Gross, MD;  Location: [facility];  Service: Orthopedics;  Laterality: Left;    Allergies  Allergen Reactions   Bee Pollen Anaphylaxis   Bee Venom Anaphylaxis   Codeine Anaphylaxis   Poison Ivy Extract [Poison Ivy Extract] Other (See Comments)    Acute reaction    Hydrocodone Itching   Oxycodone Itching   Tramadol Other (See Comments)    Reaction unknown    There were no vitals taken for this visit.      No data to display              No data to display              Objective:  Physical Exam:  Gen: NAD, comfortable in exam room  Left shoulder: No gross deformity, no ecchymosis, no swelling.  No TTP. FROM.  5/5 strength and normal sensation. Positive empty can Positive Hawkins Negative Neer's Negative O'Brien's Positive Yergason Positive speeds test   Ultrasound left shoulder: Anechoic fluid surrounding tendon of long head biceps within the bicipital groove on short axis.  Visualized ~ 80% tear of biceps tendon in long axis.  Bone spur visualized between origin of biceps tendon and insertion of subscapularis, but does not appear to involve subscapularis.  Normal subscapularis, supraspinatus, infraspinatus, teres minor.  Adequate AC joint space, with minimal effusion. Impression: 80% tear of biceps tendon, bone spur of proximal humerus.  No rotator cuff tendinopathy nor AC joint arthritis.  No signs of impingement.   Ultrasound and interpretation by Sibyl Parr. Darrick Penna, MD and Tiffany Kocher, DO   Assessment & Plan:  1.  Left shoulder pain: 2/2 biceps tendon tear, injury may be aggravated by bone spur.  Recommend she continues home exercises.  Okay to continue tennis,  but recommend avoiding lifting greater than 10 pounds. OTC anti-inflammatories and creams as needed.  Follow-up in 4 to 6 weeks, recommend repeat ultrasound to evaluate healing.  I observed and examined the patient with the resident and agree with assessment and plan.  Note reviewed and modified by me. Sterling Big, MD

## 2023-05-28 NOTE — Assessment & Plan Note (Signed)
I advised her that high-grade biceps tendon tears often completely rupture and will retract.  If they do that they tend to heal and regain strength. We will try doing biceps curls and forearm rolls. She can limit her lifting If she decides to play tennis there is some increased rest but is not primarily a biceps activity  She can use topical medicines over the biceps and over-the-counter oral medicines if needed for pain  Recheck in about 6 weeks

## 2023-06-04 ENCOUNTER — Ambulatory Visit (INDEPENDENT_AMBULATORY_CARE_PROVIDER_SITE_OTHER): Payer: BC Managed Care – PPO | Admitting: Family Medicine

## 2023-06-04 ENCOUNTER — Encounter: Payer: Self-pay | Admitting: Family Medicine

## 2023-06-04 VITALS — BP 130/82 | HR 69 | Temp 98.3°F | Resp 16 | Ht 62.0 in | Wt 141.4 lb

## 2023-06-04 DIAGNOSIS — Z0000 Encounter for general adult medical examination without abnormal findings: Secondary | ICD-10-CM | POA: Diagnosis not present

## 2023-06-04 LAB — TSH: TSH: 1.49 u[IU]/mL (ref 0.35–5.50)

## 2023-06-04 LAB — COMPREHENSIVE METABOLIC PANEL
ALT: 13 U/L (ref 0–35)
AST: 18 U/L (ref 0–37)
Albumin: 4.3 g/dL (ref 3.5–5.2)
Alkaline Phosphatase: 55 U/L (ref 39–117)
BUN: 15 mg/dL (ref 6–23)
CO2: 27 meq/L (ref 19–32)
Calcium: 9.7 mg/dL (ref 8.4–10.5)
Chloride: 107 meq/L (ref 96–112)
Creatinine, Ser: 0.93 mg/dL (ref 0.40–1.20)
GFR: 65.43 mL/min (ref >60.00)
Glucose, Bld: 91 mg/dL (ref 70–99)
Potassium: 4.5 meq/L (ref 3.5–5.1)
Sodium: 141 meq/L (ref 135–145)
Total Bilirubin: 0.7 mg/dL (ref 0.2–1.2)
Total Protein: 7 g/dL (ref 6.0–8.3)

## 2023-06-04 LAB — CBC WITH DIFFERENTIAL/PLATELET
Basophils Absolute: 0 10*3/uL (ref 0.0–0.1)
Basophils Relative: 0.5 % (ref 0.0–3.0)
Eosinophils Absolute: 0.1 10*3/uL (ref 0.0–0.7)
Eosinophils Relative: 2.4 % (ref 0.0–5.0)
HCT: 42.2 % (ref 36.0–46.0)
Hemoglobin: 13.9 g/dL (ref 12.0–15.0)
Lymphocytes Relative: 35.6 % (ref 12.0–46.0)
Lymphs Abs: 1.1 10*3/uL (ref 0.7–4.0)
MCHC: 32.9 g/dL (ref 30.0–36.0)
MCV: 93.2 fl (ref 78.0–100.0)
Monocytes Absolute: 0.2 10*3/uL (ref 0.1–1.0)
Monocytes Relative: 7.5 % (ref 3.0–12.0)
Neutro Abs: 1.7 10*3/uL (ref 1.4–7.7)
Neutrophils Relative %: 54 % (ref 43.0–77.0)
Platelets: 276 10*3/uL (ref 150.0–400.0)
RBC: 4.52 Mil/uL (ref 3.87–5.11)
RDW: 12.9 % (ref 11.5–15.5)
WBC: 3.1 10*3/uL — ABNORMAL LOW (ref 4.0–10.5)

## 2023-06-04 LAB — LIPID PANEL
Cholesterol: 209 mg/dL — ABNORMAL HIGH (ref 0–200)
HDL: 82.8 mg/dL (ref >39.00)
LDL Cholesterol: 116 mg/dL — ABNORMAL HIGH (ref 0–99)
NonHDL: 126.43
Total CHOL/HDL Ratio: 3
Triglycerides: 53 mg/dL (ref 0.0–149.0)
VLDL: 10.6 mg/dL (ref 0.0–40.0)

## 2023-06-04 LAB — HEMOGLOBIN A1C: Hgb A1c MFr Bld: 5.5 % (ref 4.6–6.5)

## 2023-06-04 NOTE — Patient Instructions (Signed)

## 2023-06-04 NOTE — Progress Notes (Signed)
Phone: [phone]   Subjective:  Patient 62 y.o. female presenting for annual physical.  Chief Complaint  Patient presents with   Annual Exam    CPE Fasting    Annual - Exercising regularly, plays tennis. Working on [employer].   Chronic pain - Taking gabapentin 300 mg at bedtime. Has hx of arthritis.   Bicep tendon tear, Left - She reports an 80% bicep tendon tear in her left arm. She was seen by sports medicine last week on 8/29 and will follow up with Dr. Enid Baas on 07/09/2023. She denies any pain. States she is still able to play tennis.  Previously Covid positive - She reports she tested positive for Covid about 8 weeks ago, she was asymptomatic. She notes she is interested in receiving the flu and Covid vaccine this season.   Has not been taking Diethylpropion 75 mg daily. Pt states she is "not really" taking it.    See problem oriented charting- ROS- ROS: Gen: no fever, chills  Skin: no rash, itching ENT: no ear pain, ear drainage, nasal congestion, rhinorrhea, sinus pressure, sore throat Eyes: no blurry vision, double vision Resp: no cough, wheeze,SOB CV: no CP, palpitations, LE edema,  GI: no heartburn, n/v/d/c, abd pain GU: no dysuria, urgency, frequency, hematuria MSK: no joint pain, myalgias, back pain Neuro: no dizziness, headache, weakness, vertigo Psych: no depression, anxiety, insomnia, SI   The following were reviewed and entered/updated in epic: Past Medical History:  Diagnosis Date   Allergy 10/17/1989   Anxiety 2020   Arthritis    Neuromuscular disorder (HCC)    carpal tunnel / Bil   No pertinent past medical history    Patient Active Problem List   Diagnosis Date Noted   Traumatic partial tear of biceps tendon, initial encounter 05/28/2023   Pain in joint involving right ankle and foot 09/09/2022   Acute pain of left shoulder 07/29/2022   OA (osteoarthritis) of knee 03/03/2016   Baker's cyst of knee 11/14/2014   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/06/2014   Low back pain of over 3 months duration 08/01/2014   Carpal tunnel syndrome on both sides 10/14/2012   Arthritis of right hand 09/07/2012   Past Surgical History:  Procedure Laterality Date   CARPAL TUNNEL RELEASE  11/05/2011   Procedure: CARPAL TUNNEL RELEASE;  Surgeon: Harvie Junior, MD;  Location: [facility];  Service: Orthopedics;  Laterality: Right;   DILATION AND CURETTAGE OF UTERUS     JOINT REPLACEMENT  03/03/2016   TONSILLECTOMY     TOTAL KNEE ARTHROPLASTY Left 03/03/2016   Procedure: LEFT TOTAL KNEE ARTHROPLASTY;  Surgeon: Ollen Gross, MD;  Location: [facility];  Service: Orthopedics;  Laterality: Left;    Family History  Problem Relation Age of Onset   Arthritis Mother    Arthritis Brother    Alcohol abuse Maternal Grandfather     Medications- reviewed and updated Current Outpatient Medications  Medication Sig Dispense Refill   [employer] SUNSCREEN SPF 30 EX apply topically to face and body daily for 30     Diethylpropion HCl CR 75 MG TB24 Take 1 tablet by mouth daily.     gabapentin (NEURONTIN) 300 MG capsule Take 1 capsule (300 mg total) by mouth at bedtime. 30 capsule 2   ibuprofen (ADVIL,MOTRIN) 200 MG tablet Take 200 mg by mouth every 8 (eight) hours as needed.     No current facility-administered medications for this visit.    Allergies-reviewed and updated Allergies  Allergen Reactions   Bee Pollen Anaphylaxis   Bee Venom Anaphylaxis   Codeine Anaphylaxis and Other (See Comments)   Poison Ivy Extract [Poison Ivy Extract] Other (See Comments)    Acute reaction   Hydrocodone Itching   Oxycodone Itching   Rabbit Epithelium Other (See Comments)    Social History   Social History Narrative   [employer]ail [employer]   Objective  Objective:  BP 130/82   Pulse 69   Temp 98.3 F (36.8 C) (Temporal)   Resp 16   Ht 5\' 2"  (1.575 m)   Wt 141 lb 6 oz (64.1 kg)   SpO2 96%   BMI 25.86 kg/m  Physical Exam  Gen: WDWN  NAD HEENT: NCAT, conjunctiva not injected, sclera nonicteric TM WNL B, OP moist, no exudates  NECK:  supple, no thyromegaly, no nodes, no carotid bruits CARDIAC: RRR, S1S2+, no murmur. DP 2+B LUNGS: CTAB. No wheezes ABDOMEN:  BS+, soft, NTND, No HSM, no masses EXT:  no edema MSK: no gross abnormalities. MS 5/5 all 4 NEURO: A&O x3.  CN II-XII intact.  PSYCH: normal mood. Good eye contact     Assessment and Plan   Health Maintenance counseling: 1. Anticipatory guidance: Patient counseled regarding regular dental exams q6 months, eye exams yearly, avoiding smoking and second hand smoke, limiting alcohol to 2 beverages per day.   2. Risk factor reduction:  Advised patient of need for regular exercise and diet rich in fruits and vegetables to reduce risk of heart attack and stroke. Exercise- Regularly exercising, plays tennis.   Wt Readings from Last 3 Encounters:  06/04/23 141 lb 6 oz (64.1 kg)  05/28/23 135 lb (61.2 kg)  12/25/22 135 lb (61.2 kg)   3. Immunizations/screenings/ancillary studies Immunization History  Administered Date(s) Administered   Influenza,inj,Quad PF,6+ Mos 06/04/2022   Influenza-Unspecified 01/06/2019, 06/29/2020   Pneumococcal-Unspecified 06/14/2019   Tdap 03/06/2021   Unspecified SARS-COV-2 Vaccination 11/26/2019, 12/24/2019, 07/13/2020, 01/30/2021   Zoster Recombinant(Shingrix) 05/30/2019, 06/04/2022   There are no preventive care reminders to display for this patient.   4. Prostate cancer screening >55yo - risk factors? No results found for: "PSA"  5. Colon cancer screening: UTD.  6. Skin cancer screening- I advised regular sunscreen use. Denies worrisome, changing, or new skin lesions.  7. Smoking associated screening (lung cancer screening, AAA screen 65-75, UA)- non smoker 8. STD screening - N/a  Wellness examination -     CBC with Differential/Platelet -     Comprehensive metabolic panel -     Lipid panel -     TSH -     Hemoglobin A1c    Wellness-anticipatory guidance.  Work on Diet/Exercise  Check CBC,CMP,lipids,TSH, A1C.  F/u 1 yr   Recommended follow up: Return in about 1 year (around 06/03/2024) for annual physical.  Lab/Order associations: +fasting    I,Rachel Rivera,acting as a scribe for Angelena Sole, MD.,have documented all relevant documentation on the behalf of Angelena Sole, MD,as directed by  Angelena Sole, MD while in the presence of Angelena Sole, MD.  I, Angelena Sole, MD, have reviewed all documentation for this visit. The documentation on 06/04/23 for the exam, diagnosis, procedures, and orders are all accurate and complete.   Angelena Sole, MD

## 2023-06-07 NOTE — Progress Notes (Signed)
Labs look great except: 1.  WBCs slightly low.  I do not have anything more recent than 7 years ago.  Does she?.  Recommend repeating CBC differential in 1 month

## 2023-06-08 ENCOUNTER — Other Ambulatory Visit: Payer: Self-pay | Admitting: *Deleted

## 2023-06-08 DIAGNOSIS — D72819 Decreased white blood cell count, unspecified: Secondary | ICD-10-CM

## 2023-06-09 ENCOUNTER — Other Ambulatory Visit: Payer: Self-pay | Admitting: *Deleted

## 2023-06-25 DIAGNOSIS — M25571 Pain in right ankle and joints of right foot: Secondary | ICD-10-CM | POA: Diagnosis not present

## 2023-07-09 ENCOUNTER — Ambulatory Visit: Payer: BC Managed Care – PPO | Admitting: Sports Medicine

## 2023-07-09 ENCOUNTER — Other Ambulatory Visit: Payer: Self-pay

## 2023-07-09 ENCOUNTER — Other Ambulatory Visit (INDEPENDENT_AMBULATORY_CARE_PROVIDER_SITE_OTHER): Payer: BC Managed Care – PPO

## 2023-07-09 VITALS — BP 136/84 | Ht 62.0 in | Wt 135.0 lb

## 2023-07-09 DIAGNOSIS — S46119A Strain of muscle, fascia and tendon of long head of biceps, unspecified arm, initial encounter: Secondary | ICD-10-CM

## 2023-07-09 DIAGNOSIS — G8929 Other chronic pain: Secondary | ICD-10-CM | POA: Diagnosis not present

## 2023-07-09 DIAGNOSIS — M19012 Primary osteoarthritis, left shoulder: Secondary | ICD-10-CM | POA: Diagnosis not present

## 2023-07-09 DIAGNOSIS — M25512 Pain in left shoulder: Secondary | ICD-10-CM

## 2023-07-09 DIAGNOSIS — D72819 Decreased white blood cell count, unspecified: Secondary | ICD-10-CM | POA: Diagnosis not present

## 2023-07-09 DIAGNOSIS — S82831A Other fracture of upper and lower end of right fibula, initial encounter for closed fracture: Secondary | ICD-10-CM | POA: Diagnosis not present

## 2023-07-09 DIAGNOSIS — M19019 Primary osteoarthritis, unspecified shoulder: Secondary | ICD-10-CM | POA: Insufficient documentation

## 2023-07-09 LAB — CBC WITH DIFFERENTIAL/PLATELET
Basophils Absolute: 0 10*3/uL (ref 0.0–0.1)
Basophils Relative: 0.3 % (ref 0.0–3.0)
Eosinophils Absolute: 0.1 10*3/uL (ref 0.0–0.7)
Eosinophils Relative: 3.5 % (ref 0.0–5.0)
HCT: 42.7 % (ref 36.0–46.0)
Hemoglobin: 13.7 g/dL (ref 12.0–15.0)
Lymphocytes Relative: 33.7 % (ref 12.0–46.0)
Lymphs Abs: 1.3 10*3/uL (ref 0.7–4.0)
MCHC: 32.1 g/dL (ref 30.0–36.0)
MCV: 94.1 fL (ref 78.0–100.0)
Monocytes Absolute: 0.3 10*3/uL (ref 0.1–1.0)
Monocytes Relative: 9 % (ref 3.0–12.0)
Neutro Abs: 2.1 10*3/uL (ref 1.4–7.7)
Neutrophils Relative %: 53.5 % (ref 43.0–77.0)
Platelets: 281 10*3/uL (ref 150.0–400.0)
RBC: 4.53 Mil/uL (ref 3.87–5.11)
RDW: 12.9 % (ref 11.5–15.5)
WBC: 3.9 10*3/uL — ABNORMAL LOW (ref 4.0–10.5)

## 2023-07-09 NOTE — Patient Instructions (Signed)
It was great to see you today! Wear the ankle compression sleeve anytime you are on your feet for a period of time. In about 2 weeks if you are feeling pretty good you can try and hit some easy tennis balls. Get Voltaren Gel to rub on your shoulder, specifically your [facility] joint, 3 times daily. Ice for 15 minutes 3-4 times a day. Do the exercises we gave you for your shoulder. Follow up in 6 weeks.

## 2023-07-09 NOTE — Assessment & Plan Note (Signed)
Be careful with any movements across body Arthritic spurring is impinging on soft tissue

## 2023-07-09 NOTE — Assessment & Plan Note (Signed)
On [place] today more tendon fibers noted - now seems like 40% of fibers torn No retraction  Now has some distal tearing noted in supraspin. Tendon  Plan is rehab with low resistance weight  Reck in 6 wks.  If progress is good can start some easy hitting  in ~ 2 weeks.  D/W patient and ease back in.

## 2023-07-09 NOTE — Assessment & Plan Note (Signed)
I tried her in a compression sleeve and she was comfortable walking in this Did not feel unstable OK to stop Boot  Wear sleeve for all activity and in 2 to 4 weeks she can start doing more activity but needs to wear the compression

## 2023-07-09 NOTE — Progress Notes (Signed)
Better/stable.  Prob from covid

## 2023-07-09 NOTE — Progress Notes (Signed)
F/U left shoulder/  Recent RT ankle injury  Left shoulder is still painful with mild improvement No night pain Doing exercises/ Can't use NTG/ Using some topical Volt.  RT ankle sprain 15 days ago XR at [facility] - small avulsion at lateral malleolus Placed in a heavy boot Note even after sprain she finished her tennis match Less swelling and tenderness  PE Pleasant F in NAD BP 136/84   Ht 5\' 2"  (1.575 m)   Wt 135 lb (61.2 kg)   BMI 24.69 kg/m   Shoulder: Left Inspection reveals no abnormalities, atrophy or asymmetry. Palpation is normal with no tenderness over AC joint or bicipital groove. ROM is full in all planes. Rotator cuff strength normal except for elevation + signs of impingement with Hawkin's tests, empty can. Both painful and weak Speeds and Yergason's tests normal strength but cause some pain No labral pathology noted with negative Obrien's, negative clunk and good stability. Normal scapular function observed. No painful arc and no drop arm sign.  RT ankle TTP located at tip of lateral malleolus and some along shaft Ligaments are stable on inversion No swelling noted Walks without boot and no real limp  Ultrasound of RT ankle Mild soft tissue swelling laterally Both peroneal tendons intact Small avulsion fragment off tip of malleolus but shaft is intact  Impression: Avulsion Fracture tip of lateral malleolus  Ultrasound of Left Shoulder Bicipital tendon at proximal area shows hypoechoic swelling More tendon fibers noted on LAX and SAX Now hypoechoic area seems to involve ~ 40% of width of tendon (80% on last visit)  Distal supraspinatus tendon shows hypoechoic change consistent with partial thickness distal tear Infraspinatus, teres minor and subscapularis intact  Narrowing and spurring at [facility] Joint with effusion  Some irregularity of anterior and superior humeral head  Impression: Partial Biceps tendon tear; Distal small tear of supraspinatus  tendon; AC arthritis  Ultrasound and interpretation by Dr. Velna Ochs and Sibyl Parr. Darrick Penna, MD

## 2023-07-10 ENCOUNTER — Encounter: Payer: Self-pay | Admitting: Family Medicine

## 2023-07-11 ENCOUNTER — Other Ambulatory Visit: Payer: Self-pay | Admitting: Sports Medicine

## 2023-07-27 ENCOUNTER — Other Ambulatory Visit: Payer: Self-pay

## 2023-07-27 MED ORDER — GABAPENTIN 300 MG PO CAPS: 300.0000 mg | ORAL_CAPSULE | Freq: Every day | ORAL | 2 refills | Status: DC

## 2023-07-27 NOTE — Progress Notes (Signed)
Pt called requesting refill on Gabapentin.  Rx sent to pharmacy.

## 2023-08-25 ENCOUNTER — Encounter: Payer: Self-pay | Admitting: Family Medicine

## 2023-08-25 ENCOUNTER — Ambulatory Visit: Payer: BC Managed Care – PPO | Admitting: Family Medicine

## 2023-08-25 ENCOUNTER — Ambulatory Visit: Payer: BC Managed Care – PPO | Admitting: Sports Medicine

## 2023-08-25 VITALS — BP 124/82 | Ht 62.0 in | Wt 135.0 lb

## 2023-08-25 DIAGNOSIS — S82831D Other fracture of upper and lower end of right fibula, subsequent encounter for closed fracture with routine healing: Secondary | ICD-10-CM | POA: Diagnosis not present

## 2023-08-25 DIAGNOSIS — S46212D Strain of muscle, fascia and tendon of other parts of biceps, left arm, subsequent encounter: Secondary | ICD-10-CM

## 2023-08-25 MED ORDER — GABAPENTIN 300 MG PO CAPS: 600.0000 mg | ORAL_CAPSULE | Freq: Every day | ORAL | 1 refills | Status: DC

## 2023-08-25 NOTE — Patient Instructions (Signed)
I sent a new prescription for Gabapention 300mg  2 tabs at bedtime to your pharmacy. You should continue with the Voltaren gel - you can apply up to every 6 hours as needed You should continue your shoulder exercises You should continue your ankle sleeve You should follow-up with [place] in about 4 weeks

## 2023-08-25 NOTE — Assessment & Plan Note (Signed)
Prior MSK ultrasound showing some healing of prior tear, but no retraction.  She has had no significant change since last visit and her symptoms.  She has been able to return to some tennis  Plan: -She will continue her home exercises with low resistance -We will increase her gabapentin to 600 mg p.o. at bedtime.  New prescription sent to the pharmacy for her -She can slowly increase her activity as symptoms allow -Follow-up 4 to 6 weeks for reevaluation, consider follow-up ultrasound at that visit versus MRI if she is not improving

## 2023-08-25 NOTE — Progress Notes (Signed)
DATE OF VISIT: 08/25/2023        Paige Woodard DOB: [birth year] MRN: [id number]  CC:  f/u Rt ankle, f/u Lt shoulder  History of present Illness: Paige Woodard is a 62 y.o. female who presents for a follow-up visit Last seen by Dr Darrick Penna 07/09/23 - has Lt shoulder partial biceps tear and small tear of supraspinatus - has Rt ankle avulsion from tip of lateral malleolus  Since last visit she reports: RE: Lt shoulder Lt shoulder has not been feeling good Not getting better Waking her at night - gabapentin does help with that - potentially interested in increasing gabapentin at bedtime - doing HEP - sometimes having trouble with exercises - has been able to play tennis  -- trouble with serving -- playing few days/wk -- light hitting  RE: Rt ankle Feeling much better Still some swelling along lateral ankle Wearing ankle sleeve  Medications:  Outpatient Encounter Medications as of 08/25/2023  Medication Sig   [employer] SUNSCREEN SPF 30 EX apply topically to face and body daily for 30   Diethylpropion HCl CR 75 MG TB24 Take 1 tablet by mouth daily.   gabapentin (NEURONTIN) 300 MG capsule Take 2 capsules (600 mg total) by mouth at bedtime.   ibuprofen (ADVIL,MOTRIN) 200 MG tablet Take 200 mg by mouth every 8 (eight) hours as needed.   [DISCONTINUED] gabapentin (NEURONTIN) 300 MG capsule Take 1 capsule (300 mg total) by mouth at bedtime.   No facility-administered encounter medications on file as of 08/25/2023.    Allergies: is allergic to bee pollen, bee venom, codeine, poison ivy extract [poison ivy extract], hydrocodone, oxycodone, and rabbit epithelium.  Physical Examination: Vitals: BP 124/82   Ht 5\' 2"  (1.575 m)   Wt 135 lb (61.2 kg)   BMI 24.69 kg/m  GENERAL:  Paige Woodard is a 62 y.o. female appearing their stated age, alert and oriented x 3, in no apparent distress.  MSK:  Left shoulder with full range of motion with positive painful arc.  Tender to palpation  over the bicipital groove, mild tenderness over the greater tuberosity.  No tenderness over the [facility] joint today.  Positive Neer, negative Hawkins, positive empty can, negative speeds.  Rotator cuff strength 5 -/5 throughout Right shoulder full range of motion without pain, weakness  Right ankle with minimal lateral soft tissue swelling just posterior to the lateral malleolus.  Very minimal tenderness to palpation along the tip of the lateral malleolus.  Full range of motion with slight pain with inversion and eversion.  Ankle strength 5/5 throughout. Left ankle with full range of motion without pain. Walking without a limp  N/V/I distally Assessment & Plan Closed avulsion fracture of distal end of right fibula with routine healing, subsequent encounter Greatly improved  Plan: -Continue to use ankle sleeve with activity -Continue to advance activity as tolerated -Follow-up 4 to 6 weeks to reevaluate, sooner as needed Traumatic partial tear of left biceps tendon, subsequent encounter Prior MSK ultrasound showing some healing of prior tear, but no retraction.  She has had no significant change since last visit and her symptoms.  She has been able to return to some tennis  Plan: -She will continue her home exercises with low resistance -We will increase her gabapentin to 600 mg p.o. at bedtime.  New prescription sent to the pharmacy for her -She can slowly increase her activity as symptoms allow -Follow-up 4 to 6 weeks for reevaluation, consider follow-up ultrasound at that visit versus MRI  if she is not improving  Patient expressed understanding & agreement with above.  Encounter Diagnoses  Name Primary?   Closed avulsion fracture of distal end of right fibula with routine healing, subsequent encounter Yes   Traumatic partial tear of left biceps tendon, subsequent encounter     No orders of the defined types were placed in this encounter.

## 2023-08-25 NOTE — Assessment & Plan Note (Signed)
Greatly improved  Plan: -Continue to use ankle sleeve with activity -Continue to advance activity as tolerated -Follow-up 4 to 6 weeks to reevaluate, sooner as needed

## 2023-09-03 ENCOUNTER — Other Ambulatory Visit: Payer: Self-pay

## 2023-09-03 ENCOUNTER — Ambulatory Visit: Payer: BC Managed Care – PPO | Admitting: Family Medicine

## 2023-09-03 VITALS — BP 124/84 | Ht 62.0 in | Wt 135.0 lb

## 2023-09-03 DIAGNOSIS — M25561 Pain in right knee: Secondary | ICD-10-CM | POA: Diagnosis not present

## 2023-09-03 NOTE — Patient Instructions (Signed)
Your ultrasound is reassuring aside from the effusion (fluid in the joint). Compression sleeve when up and walking around. Icing 15 minutes at a time 3-4 times a day at least. Elevate above your heart to help with swelling. Straight leg raises, knee extensions 3 sets of 10 once a day to maintain quad strength. Ibuprofen three times a day for pain and inflammation - can take as needed. Follow up with [place] in 1-2 weeks for reevaluation. If not improving would consider an MRI (your insurance may make [place] do x-rays first though).

## 2023-09-04 NOTE — Progress Notes (Signed)
PCP: Jeani Sow, MD  Subjective:   HPI: Patient is a 62 y.o. female here for right knee injury.  Patient reports on 12/3 she was playing tennis, twisted and felt/heard a pop in her right knee. Pain severe yesterday and difficulty walking but a little better today. No visible swelling. No locking but feels like it's snapping behind her right knee. Has been using a sleeve and icing. Has history of left knee arthroplasty but when seen Dr. Lequita Halt in the past told the right knee looked good.  Past Medical History:  Diagnosis Date   Allergy 10/17/1989   Anxiety 2020   Arthritis    Neuromuscular disorder (HCC)    carpal tunnel / Bil   No pertinent past medical history     Current Outpatient Medications on File Prior to Visit  Medication Sig Dispense Refill   [employer] SUNSCREEN SPF 30 EX apply topically to face and body daily for 30     Diethylpropion HCl CR 75 MG TB24 Take 1 tablet by mouth daily.     gabapentin (NEURONTIN) 300 MG capsule Take 2 capsules (600 mg total) by mouth at bedtime. 180 capsule 1   ibuprofen (ADVIL,MOTRIN) 200 MG tablet Take 200 mg by mouth every 8 (eight) hours as needed.     No current facility-administered medications on file prior to visit.    Past Surgical History:  Procedure Laterality Date   CARPAL TUNNEL RELEASE  11/05/2011   Procedure: CARPAL TUNNEL RELEASE;  Surgeon: Harvie Junior, MD;  Location: [facility];  Service: Orthopedics;  Laterality: Right;   DILATION AND CURETTAGE OF UTERUS     JOINT REPLACEMENT  03/03/2016   TONSILLECTOMY     TOTAL KNEE ARTHROPLASTY Left 03/03/2016   Procedure: LEFT TOTAL KNEE ARTHROPLASTY;  Surgeon: Ollen Gross, MD;  Location: [facility];  Service: Orthopedics;  Laterality: Left;    Allergies  Allergen Reactions   Bee Pollen Anaphylaxis   Bee Venom Anaphylaxis   Codeine Anaphylaxis and Other (See Comments)   Poison Ivy Extract [Poison Ivy Extract] Other (See Comments)    Acute reaction    Hydrocodone Itching   Oxycodone Itching   Rabbit Epithelium Other (See Comments)    BP 124/84   Ht 5\' 2"  (1.575 m)   Wt 135 lb (61.2 kg)   BMI 24.69 kg/m       No data to display              No data to display              Objective:  Physical Exam:  Gen: NAD, comfortable in exam room  Right knee: Mild effusion.  No other gross deformity, ecchymoses. No medial or lateral joint line TTP. ROM 0 - 100 degrees.  Normal strength with flexion and extension without pain. Guarding with ligamentous testing.  Negative ant/post drawers. Negative valgus/varus testing. Negative lachman. Negative mcmurrays, apleys. NV intact distally.  Limited MSK u/s right knee:  moderate joint effusion.  Quad and patellar tendons intact.  No visible medial or lateral meniscus tears.  MCL and LCL intact.  Cannot fully visualize ACL and PCL - only some fibers visible.  Popliteal vein compressible.  Small baker's cyst.   Assessment & Plan:  1. Right knee injury - mechanism concerning along with effusion.  She is guarding on exam but no obvious ligamentous or meniscal injury.  Recommend continued compression, icing, elevation, quad strengthening, ibuprofen.  F/u in 1-2 weeks for reevaluation and repeat  ACL/PCL testing.  Consider MRI if not improving or exam positive at that time.

## 2023-09-07 ENCOUNTER — Encounter: Payer: Self-pay | Admitting: Family Medicine

## 2023-09-07 ENCOUNTER — Ambulatory Visit (HOSPITAL_COMMUNITY)
Admission: RE | Admit: 2023-09-07 | Discharge: 2023-09-07 | Disposition: A | Discharge status: Home / Self Care | Payer: BC Managed Care – PPO | Source: Ambulatory Visit | Attending: Family Medicine | Admitting: Family Medicine

## 2023-09-07 ENCOUNTER — Ambulatory Visit: Payer: BC Managed Care – PPO | Admitting: Family Medicine

## 2023-09-07 VITALS — BP 121/78 | Ht 62.0 in | Wt 135.0 lb

## 2023-09-07 DIAGNOSIS — M79661 Pain in right lower leg: Secondary | ICD-10-CM | POA: Insufficient documentation

## 2023-09-07 DIAGNOSIS — M7989 Other specified soft tissue disorders: Secondary | ICD-10-CM | POA: Insufficient documentation

## 2023-09-08 ENCOUNTER — Encounter: Payer: Self-pay | Admitting: Family Medicine

## 2023-09-08 NOTE — Progress Notes (Signed)
PCP: Jeani Sow, MD  Subjective:   HPI: Patient is a 62 y.o. female here for right knee injury.  12/5: Patient reports on 12/3 she was playing tennis, twisted and felt/heard a pop in her right knee. Pain severe yesterday and difficulty walking but a little better today. No visible swelling. No locking but feels like it's snapping behind her right knee. Has been using a sleeve and icing. Has history of left knee arthroplasty but when seen Dr. Lequita Halt in the past told the right knee looked good.  12/9: Patient returns with worsening pain in her right calf. Difficulty sleeping. Associated swelling though this is better today. Calf has been tight. Planning to travel Wednesday through Friday and wanted to ensure she doesn't have a DVT.  Past Medical History:  Diagnosis Date   Allergy 10/17/1989   Anxiety 2020   Arthritis    Neuromuscular disorder (HCC)    carpal tunnel / Bil   No pertinent past medical history     Current Outpatient Medications on File Prior to Visit  Medication Sig Dispense Refill   [employer] SUNSCREEN SPF 30 EX apply topically to face and body daily for 30     Diethylpropion HCl CR 75 MG TB24 Take 1 tablet by mouth daily.     gabapentin (NEURONTIN) 300 MG capsule Take 2 capsules (600 mg total) by mouth at bedtime. 180 capsule 1   ibuprofen (ADVIL,MOTRIN) 200 MG tablet Take 200 mg by mouth every 8 (eight) hours as needed.     No current facility-administered medications on file prior to visit.    Past Surgical History:  Procedure Laterality Date   CARPAL TUNNEL RELEASE  11/05/2011   Procedure: CARPAL TUNNEL RELEASE;  Surgeon: Harvie Junior, MD;  Location: [facility];  Service: Orthopedics;  Laterality: Right;   DILATION AND CURETTAGE OF UTERUS     JOINT REPLACEMENT  03/03/2016   TONSILLECTOMY     TOTAL KNEE ARTHROPLASTY Left 03/03/2016   Procedure: LEFT TOTAL KNEE ARTHROPLASTY;  Surgeon: Ollen Gross, MD;  Location: [facility];  Service:  Orthopedics;  Laterality: Left;    Allergies  Allergen Reactions   Bee Pollen Anaphylaxis   Bee Venom Anaphylaxis   Codeine Anaphylaxis and Other (See Comments)   Poison Ivy Extract [Poison Ivy Extract] Other (See Comments)    Acute reaction   Hydrocodone Itching   Oxycodone Itching   Rabbit Epithelium Other (See Comments)    BP 121/78   Ht 5\' 2"  (1.575 m)   Wt 135 lb (61.2 kg)   BMI 24.69 kg/m       No data to display              No data to display              Objective:  Physical Exam:  Gen: NAD, comfortable in exam room  Right knee: Mild effusion.   FROM with normal strength. Mild tenderness popliteal fossa.  No calf tenderness. Negative ant/post drawers. Negative valgus/varus testing. Negative lachman. Negative lever. Negative mcmurrays, apleys.  NV intact distally.   Assessment & Plan:  1. Right knee injury - less guarding today - able to feel solid end points on ACL and PCL testing.  Suspect her calf pain, tightness, swelling is related to effusion leaking into the soft tissues but given concern for DVT will proceed with doppler u/s.  History, exam consistent with partial tear of ACL or PCL.  Continue conservative treatment if doppler negative with  compression, icing, elevation.  Quad strengthening.  Ibuprofen if needed.

## 2023-09-15 DIAGNOSIS — L570 Actinic keratosis: Secondary | ICD-10-CM | POA: Diagnosis not present

## 2023-09-15 DIAGNOSIS — L821 Other seborrheic keratosis: Secondary | ICD-10-CM | POA: Diagnosis not present

## 2023-09-15 DIAGNOSIS — D225 Melanocytic nevi of trunk: Secondary | ICD-10-CM | POA: Diagnosis not present

## 2023-09-15 DIAGNOSIS — L578 Other skin changes due to chronic exposure to nonionizing radiation: Secondary | ICD-10-CM | POA: Diagnosis not present

## 2023-09-15 DIAGNOSIS — L814 Other melanin hyperpigmentation: Secondary | ICD-10-CM | POA: Diagnosis not present

## 2023-09-17 ENCOUNTER — Ambulatory Visit
Admission: RE | Admit: 2023-09-17 | Discharge: 2023-09-17 | Disposition: A | Discharge status: Home / Self Care | Payer: BC Managed Care – PPO | Source: Ambulatory Visit | Attending: Sports Medicine | Admitting: Sports Medicine

## 2023-09-17 ENCOUNTER — Ambulatory Visit: Payer: BC Managed Care – PPO | Admitting: Sports Medicine

## 2023-09-17 VITALS — BP 138/88 | Ht 62.0 in | Wt 135.0 lb

## 2023-09-17 DIAGNOSIS — M25561 Pain in right knee: Secondary | ICD-10-CM | POA: Diagnosis not present

## 2023-09-17 DIAGNOSIS — M1711 Unilateral primary osteoarthritis, right knee: Secondary | ICD-10-CM | POA: Diagnosis not present

## 2023-09-17 DIAGNOSIS — M2391 Unspecified internal derangement of right knee: Secondary | ICD-10-CM

## 2023-09-17 NOTE — Progress Notes (Addendum)
RUBELL CANTARELLA - 62 y.o. female MRN [id number]  Date of birth: 12-Feb-1961  PCP: Jeani Sow, MD  Subjective:   HPI:  Patient is a 62 y.o. female here for follow up on knee pain, last seen on 09/07/2023.  Patient states that the pain is gotten worse since last visit.  Primarily they have pain medial to the inferior patella which is new.  She describes the pain as sharp, worse with flexion and walking.  Better with rest, ibuprofen, knee brace.  Some potential instability, and catching at times.  Of note, at last visit she had a great amount of swelling of the lower extremity which prompted the DVT study which was negative.   Past Medical History:  Diagnosis Date   Allergy 10/17/1989   Anxiety 2020   Arthritis    Neuromuscular disorder (HCC)    carpal tunnel / Bil   No pertinent past medical history     Current Outpatient Medications on File Prior to Visit  Medication Sig Dispense Refill   [employer]S SUNSCREEN SPF 30 EX apply topically to face and body daily for 30     Diethylpropion HCl CR 75 MG TB24 Take 1 tablet by mouth daily.     gabapentin (NEURONTIN) 300 MG capsule Take 2 capsules (600 mg total) by mouth at bedtime. 180 capsule 1   ibuprofen (ADVIL,MOTRIN) 200 MG tablet Take 200 mg by mouth every 8 (eight) hours as needed.     No current facility-administered medications on file prior to visit.    Past Surgical History:  Procedure Laterality Date   CARPAL TUNNEL RELEASE  11/05/2011   Procedure: CARPAL TUNNEL RELEASE;  Surgeon: Harvie Junior, MD;  Location: [facility];  Service: Orthopedics;  Laterality: Right;   DILATION AND CURETTAGE OF UTERUS     JOINT REPLACEMENT  03/03/2016   TONSILLECTOMY     TOTAL KNEE ARTHROPLASTY Left 03/03/2016   Procedure: LEFT TOTAL KNEE ARTHROPLASTY;  Surgeon: Ollen Gross, MD;  Location: [facility];  Service: Orthopedics;  Laterality: Left;    Allergies  Allergen Reactions   Bee Pollen Anaphylaxis   Bee Venom Anaphylaxis    Codeine Anaphylaxis and Other (See Comments)   Poison Ivy Extract [Poison Ivy Extract] Other (See Comments)    Acute reaction   Hydrocodone Itching   Oxycodone Itching   Rabbit Epithelium Other (See Comments)        Objective:  Physical Exam: VS: BP:138/88  HR: bpm  TEMP: ( )  RESP:   HT:5\' 2"  (157.5 cm)   WT:135 lb (61.2 kg)  BMI:24.69  Gen: NAD, speaks clearly, comfortable in exam room Respiratory: Normal respiratory effort on room air. No signs of distress Skin: No rashes, abrasions, or ecchymosis Knee: Inspection: Some edema of the knee, tibia and ankle. ROM: Pain with flexion, limited range of motion to about 120 degrees. Pain with palpation of the knee medial to the inferior patella.  Somewhat pain with palpation of the posterior knee. Special Tests: Pain with Lachman, however otherwise negative.    Assessment & Plan:   Internal derangement of knee joint, right Given the patient's history and exam findings, high suspicion for internal derangement of the knee.  Likely has some level of degenerative changes, potentially worse in the patellofemoral joint.  There is also suspicion for meniscal derangement given her presentation.  Given ongoing issues, worsening of pain, and debility we will pursue MR imaging of the right knee.   Lovie Macadamia MD  I  observed and examined the patient with the resident and agree with assessment and plan.  Note reviewed and modified by me.  Considering her persistence of swelling and mechanical symptoms I think advanced imaging is warranted. We scheduled her for an open MRI as she is very claustrophobic.  Sterling Big, MD

## 2023-09-17 NOTE — Assessment & Plan Note (Signed)
Given the patient's history and exam findings, high suspicion for internal derangement of the knee.  Likely has some level of degenerative changes, potentially worse in the patellofemoral joint.  There is also suspicion for meniscal derangement given her presentation.  Given ongoing issues, worsening of pain, and debility we will pursue MR imaging of the right knee.

## 2023-09-17 NOTE — Patient Instructions (Addendum)
Call to sched your MRI at:  [facility] Address: [address] [place], [place] [address] Phone: [phone]

## 2023-09-29 DIAGNOSIS — S83421A Sprain of lateral collateral ligament of right knee, initial encounter: Secondary | ICD-10-CM | POA: Diagnosis not present

## 2023-09-29 DIAGNOSIS — S83411A Sprain of medial collateral ligament of right knee, initial encounter: Secondary | ICD-10-CM | POA: Diagnosis not present

## 2023-09-29 DIAGNOSIS — M65961 Unspecified synovitis and tenosynovitis, right lower leg: Secondary | ICD-10-CM | POA: Diagnosis not present

## 2023-09-29 DIAGNOSIS — S83231A Complex tear of medial meniscus, current injury, right knee, initial encounter: Secondary | ICD-10-CM | POA: Diagnosis not present

## 2023-10-07 ENCOUNTER — Ambulatory Visit: Payer: BC Managed Care – PPO | Admitting: Family Medicine

## 2023-10-16 DIAGNOSIS — S83241A Other tear of medial meniscus, current injury, right knee, initial encounter: Secondary | ICD-10-CM | POA: Diagnosis not present

## 2023-10-16 DIAGNOSIS — M1711 Unilateral primary osteoarthritis, right knee: Secondary | ICD-10-CM | POA: Diagnosis not present

## 2023-11-13 DIAGNOSIS — S83241A Other tear of medial meniscus, current injury, right knee, initial encounter: Secondary | ICD-10-CM | POA: Diagnosis not present

## 2023-11-13 DIAGNOSIS — M7121 Synovial cyst of popliteal space [Baker], right knee: Secondary | ICD-10-CM | POA: Diagnosis not present

## 2023-11-13 DIAGNOSIS — M170 Bilateral primary osteoarthritis of knee: Secondary | ICD-10-CM | POA: Diagnosis not present

## 2023-11-26 DIAGNOSIS — M1711 Unilateral primary osteoarthritis, right knee: Secondary | ICD-10-CM | POA: Diagnosis not present

## 2023-12-03 DIAGNOSIS — M1711 Unilateral primary osteoarthritis, right knee: Secondary | ICD-10-CM | POA: Diagnosis not present

## 2023-12-10 DIAGNOSIS — M1711 Unilateral primary osteoarthritis, right knee: Secondary | ICD-10-CM | POA: Diagnosis not present

## 2024-01-07 DIAGNOSIS — L03032 Cellulitis of left toe: Secondary | ICD-10-CM | POA: Diagnosis not present

## 2024-01-07 DIAGNOSIS — M2012 Hallux valgus (acquired), left foot: Secondary | ICD-10-CM | POA: Diagnosis not present

## 2024-01-26 DIAGNOSIS — L600 Ingrowing nail: Secondary | ICD-10-CM | POA: Diagnosis not present

## 2024-01-28 DIAGNOSIS — S83241A Other tear of medial meniscus, current injury, right knee, initial encounter: Secondary | ICD-10-CM | POA: Diagnosis not present

## 2024-01-28 DIAGNOSIS — M1711 Unilateral primary osteoarthritis, right knee: Secondary | ICD-10-CM | POA: Diagnosis not present

## 2024-03-02 ENCOUNTER — Other Ambulatory Visit: Payer: Self-pay | Admitting: Family Medicine

## 2024-03-09 DIAGNOSIS — S83241A Other tear of medial meniscus, current injury, right knee, initial encounter: Secondary | ICD-10-CM | POA: Diagnosis not present

## 2024-03-09 DIAGNOSIS — M1711 Unilateral primary osteoarthritis, right knee: Secondary | ICD-10-CM | POA: Diagnosis not present

## 2024-03-14 ENCOUNTER — Telehealth: Payer: Self-pay

## 2024-03-14 NOTE — Telephone Encounter (Signed)
 Copied from CRM [id number]. Topic: General - Other >> Mar 14, 2024 11:25 AM Martinique E wrote: Reason for CRM: Patient called in stating that she is having a total knee replacement of her right knee on August 12th and questioning what needs to be done in order for PCP to sign off on this surgery. Callback number for patient is [phone].

## 2024-04-07 DIAGNOSIS — M7542 Impingement syndrome of left shoulder: Secondary | ICD-10-CM | POA: Diagnosis not present

## 2024-04-07 DIAGNOSIS — M7532 Calcific tendinitis of left shoulder: Secondary | ICD-10-CM | POA: Diagnosis not present

## 2024-04-13 ENCOUNTER — Ambulatory Visit (INDEPENDENT_AMBULATORY_CARE_PROVIDER_SITE_OTHER): Admitting: Family Medicine

## 2024-04-13 ENCOUNTER — Encounter: Payer: Self-pay | Admitting: Family Medicine

## 2024-04-13 VITALS — BP 170/100 | HR 91 | Temp 97.9°F | Resp 18 | Ht 62.0 in | Wt 135.5 lb

## 2024-04-13 DIAGNOSIS — H029 Unspecified disorder of eyelid: Secondary | ICD-10-CM

## 2024-04-13 MED ORDER — EPINEPHRINE 0.3 MG/0.3ML IJ SOAJ: 0.3000 mg | INTRAMUSCULAR | 1 refills | Status: AC | PRN

## 2024-04-13 MED ORDER — VALACYCLOVIR HCL 1 G PO TABS: 1000.0000 mg | ORAL_TABLET | Freq: Three times a day (TID) | ORAL | 0 refills | Status: DC

## 2024-04-13 NOTE — Patient Instructions (Signed)
 It was very nice to see you today!  Warm soaks to eyelid 2-3x/day    Check bp's and bring cuff to appt on 8/1   PLEASE NOTE:  If you had any lab tests please let us  know if you have not heard back within a few days. You may see your results on MyChart before we have a chance to review them but we will give you a call once they are reviewed by us . If we ordered any referrals today, please let us  know if you have not heard from their office within the next week.   Please try these tips to maintain a healthy lifestyle:  Eat most of your calories during the day when you are active. Eliminate processed foods including packaged sweets (pies, cakes, cookies), reduce intake of potatoes, white bread, white pasta, and white rice. Look for whole grain options, oat flour or almond flour.  Each meal should contain half fruits/vegetables, one quarter protein, and one quarter carbs (no bigger than a computer mouse).  Cut down on sweet beverages. This includes juice, soda, and sweet tea. Also watch fruit intake, though this is a healthier sweet option, it still contains natural sugar! Limit to 3 servings daily.  Drink at least 1 glass of water with each meal and aim for at least 8 glasses per day  Exercise at least 150 minutes every week.

## 2024-04-13 NOTE — Progress Notes (Signed)
 Subjective:     Patient ID: Paige Woodard, female    DOB: 1961-02-24, 63 y.o.   MRN: [id number]  Chief Complaint  Patient presents with   Right eyelid redness    Right eyelid red, noticed this morning, no pain    HPI Discussed the use of AI scribe software for clinical note transcription with the patient, who gave verbal consent to proceed.  History of Present Illness Paige Woodard is a 63 year old female who presents with right eyelid swelling and bruising. She is accompanied by her husband, Paige Woodard, for moral support.  She went to bed last night without any noticeable issues with her right eyelid, but upon waking this morning, she noticed possible swelling and a discolored appearance around 10 AM. There is no pain or recent trauma to the area. She is unsure about itching, but notes it may itch a little. She mentions a pre-existing scar on the eyelid from a sty she had when she was [age], but notes that the current swelling is new. No recent changes in makeup use or application of fake eyelashes. No truama. No fever, chills, double vision, or blurry vision. She describes the sensation in her eyelid as 'feeling kind of funny' but not painful. She also mentions that she bruises easily, describing herself as bruising 'like a banana'.  She has a history of being stung and bitten by insects, which has previously required the use of an EpiPen . Her current EpiPen  is four years out of date and she needs a renewal.  She reports significant stress over the past month due to the recent passing of her mother and a close friend, as well as upcoming knee replacement surgery scheduled for August 12. These events have contributed to her stress levels.  She has received shingles vaccinations.I am concerned about the possibility of shingles, especially given her current stress levels.    Health Maintenance Due  Topic Date Due   HIV Screening  Never done   MAMMOGRAM  04/10/2024    Past Medical  History:  Diagnosis Date   Allergy 10/17/1989   Anxiety 2020   Arthritis    Neuromuscular disorder (HCC)    carpal tunnel / Bil   No pertinent past medical history     Past Surgical History:  Procedure Laterality Date   CARPAL TUNNEL RELEASE  11/05/2011   Procedure: CARPAL TUNNEL RELEASE;  Surgeon: Norleen LITTIE Gavel, MD;  Location: [facility];  Service: Orthopedics;  Laterality: Right;   DILATION AND CURETTAGE OF UTERUS     JOINT REPLACEMENT  03/03/2016   TONSILLECTOMY     TOTAL KNEE ARTHROPLASTY Left 03/03/2016   Procedure: LEFT TOTAL KNEE ARTHROPLASTY;  Surgeon: Dempsey Moan, MD;  Location: [facility];  Service: Orthopedics;  Laterality: Left;     Current Outpatient Medications:    CVS SUNSCREEN SPF 30 EX, apply topically to face and body daily for 30, Disp: , Rfl:    Diethylpropion HCl CR 75 MG TB24, Take 1 tablet by mouth daily., Disp: , Rfl:    EPINEPHrine  0.3 mg/0.3 mL IJ SOAJ injection, Inject 0.3 mg into the muscle as needed for anaphylaxis., Disp: 1 each, Rfl: 1   ibuprofen (ADVIL,MOTRIN) 200 MG tablet, Take 200 mg by mouth every 8 (eight) hours as needed., Disp: , Rfl:    valACYclovir  (VALTREX ) 1000 MG tablet, Take 1 tablet (1,000 mg total) by mouth 3 (three) times daily., Disp: 21 tablet, Rfl: 0  Allergies  Allergen Reactions  Bee Pollen Anaphylaxis   Bee Venom Anaphylaxis   Codeine Anaphylaxis and Other (See Comments)   Hydrocodone Itching   Oxycodone Itching   Poison Ivy Extract Other (See Comments)    Acute reaction  poison ivy extract   Other Other (See Comments)   Rabbit Epithelium Other (See Comments)   Rabbit Epithelium Allergy Skin Test Other (See Comments)   ROS neg/noncontributory except as noted HPI/below      Objective:     BP (!) 170/100 (BP Location: Left Arm, Patient Position: Sitting, Cuff Size: Normal)   Pulse 91   Temp 97.9 F (36.6 C) (Temporal)   Resp 18   Ht 5' 2 (1.575 m)   Wt 135 lb 8 oz (61.5 kg)   SpO2 99%   BMI  24.78 kg/m  Wt Readings from Last 3 Encounters:  04/13/24 135 lb 8 oz (61.5 kg)  09/17/23 135 lb (61.2 kg)  09/07/23 135 lb (61.2 kg)    Physical Exam   Gen: WDWN NAD HEENT: NCAT, conjunctiva not injected, sclera nonicteric.  Eomi.  R upper eyelid-old lump scar in center.  Nt.  No edema.  Bruise from canthus to about 2/3.  The medial is purple while more lateral is red-purple.  No vessicles.  CARDIAC: RRR, S1S2+, no murmur.  EXT:  no edema MSK: no gross abnormalities.  NEURO: A&O x3.  CN II-XII intact.  PSYCH: normal mood. Good eye contact Some scattered bruises.      Assessment & Plan:  Eyelid abnormality  Other orders -     EPINEPHrine ; Inject 0.3 mg into the muscle as needed for anaphylaxis.  Dispense: 1 each; Refill: 1 -     valACYclovir  HCl; Take 1 tablet (1,000 mg total) by mouth 3 (three) times daily.  Dispense: 21 tablet; Refill: 0  Assessment and Plan Assessment & Plan Right eyelid swelling and bruising   She presents with acute swelling and bruising of the right eyelid, without pain, itching, or vision changes. Differential diagnosis includes shingles, stye, or insect bite, or trauma(rubbing eye) with no signs of bacterial infection. Easy bruising and recent stress may contribute to the condition. Shingles is a concern due to stress and recent life events, despite previous vaccination. Valacyclovir  is prescribed as a precautionary measure. If shingles develops, it can lessen symptoms and inhibit the virus, but not prevent an outbreak entirely. Start valacyclovir  . Apply warm compresses to the eyelid. Consider ophthalmology referral if symptoms persist. Let us  know if things change  Knee osteoarthritis (upcoming knee replacement)   She is scheduled for knee replacement surgery on August 12, with pre-surgical clearance and blood work planned for late July or early August. She expresses anxiety about the upcoming surgery. Perform pre-surgical clearance and blood work before  the knee replacement surgery.  Hypertension (White coat syndrome)   Her elevated blood pressure is likely due to anxiety and stress related to recent life events and the upcoming surgery. White coat syndrome is noted, with blood pressure usually normal outside of the clinical setting. Monitor blood pressure at home using a sphygmomanometer and bring it to the next appointment for comparison.  Anaphylaxis risk   She has a risk of severe allergic reactions requiring epinephrine . Her current EpiPen  expired four years ago. Discussed nasal form of epinephrine , but noted lack of information and potential insurance coverage issues. Prescribe a new EpiPen  and send the prescription to [facility] pharmacy.    Return if symptoms worsen or fail to improve.  Jenkins CHRISTELLA Carrel, MD

## 2024-04-14 ENCOUNTER — Telehealth: Payer: Self-pay | Admitting: *Deleted

## 2024-04-14 ENCOUNTER — Other Ambulatory Visit: Payer: Self-pay | Admitting: *Deleted

## 2024-04-14 DIAGNOSIS — Z0100 Encounter for examination of eyes and vision without abnormal findings: Secondary | ICD-10-CM

## 2024-04-14 NOTE — Telephone Encounter (Signed)
 Referral placed.

## 2024-04-14 NOTE — Telephone Encounter (Signed)
 Copied from CRM [id number]. Topic: Referral - Request for Referral >> Apr 14, 2024 11:23 AM Laymon HERO wrote: Did the patient discuss referral with their provider in the last year? Yes (If No - schedule appointment) (If Yes - send message)  Appointment offered? Yes  Type of order/referral and detailed reason for visit: Eye appointment  Preference of office, provider, location: [facility] - ophthalmologist - [address] [place], [place], [place] [address]  [phone]  If referral order, have you been seen by this specialty before? Yes (If Yes, this issue or another issue? When? Where?  Can we respond through MyChart? Yes

## 2024-04-15 ENCOUNTER — Other Ambulatory Visit: Payer: Self-pay | Admitting: *Deleted

## 2024-04-15 ENCOUNTER — Ambulatory Visit: Payer: Self-pay

## 2024-04-15 MED ORDER — AMLODIPINE BESYLATE 5 MG PO TABS: 5.0000 mg | ORAL_TABLET | Freq: Every day | ORAL | 0 refills | Status: DC

## 2024-04-15 NOTE — Telephone Encounter (Signed)
 Patient notified of message below.

## 2024-04-15 NOTE — Telephone Encounter (Signed)
 FYI Only or Action Required?: Action required by provider: refusing disposition due to upcoming vacation, requesting feedback from Dr. Wendolyn asap.  Patient was last seen in primary care on 04/13/2024 by Paige Jenkins Jansky, MD.  Called Nurse Triage reporting Hypertension and Anxiety.  Symptoms began several days ago.  Interventions attempted: Prescription medications: valtrex  for potential shingles per Dr. Wendolyn and Rest, hydration, or home remedies.  Symptoms are: gradually worsening.  Triage Disposition: See PCP When Office is Open (Within 3 Days)  Patient/caregiver understands and will follow disposition?: No, wishes to speak with PCP       Reason for Disposition  Systolic BP >= 160 OR Diastolic >= 100  Answer Assessment - Initial Assessment Questions Went to see Dr. Wendolyn on Wednesday woke up with black eye, no idea what happened, possibility shingles, talked about stress, BP was very high, took at start of appt and end of visit, wanted me to take it at home Yesterday am 139/something, evening higher Just took it right now 160/100 Now whenever taking BP higher since freaking out not 1. BLOOD PRESSURE: What is your blood pressure? Did you take at least two measurements 5 minutes apart?     Not able to take BP right now, out of home, home BP cuff 4. HISTORY: Do you have a history of high blood pressure?     No, hx white coat syndrome but high of like 140 systolic 5. MEDICINES: Are you taking any medicines for blood pressure? Have you missed any doses recently?     no 6. OTHER SYMPTOMS: Do you have any symptoms? (e.g., blurred vision, chest pain, difficulty breathing, headache, weakness)     No blurred vision, chest pain, SOB, headache, weakness, swelling Just feel anxious Nothing else besides black eye Asking if anything else can do before vacation No pain/itching/blisters, just mysterious black eye Taking valtrex  in case of shingles, never taken BP meds  Leave  for vacation on Sunday   Advised pt be examined in next 3 days with PCP, pt reporting she will be out of town for a week starting Sunday, requesting feedback from Dr. Wendolyn today if possible. Sending message for call back to pt with further recommendations. Advised pt call back or seek immediate care if BP readings get higher and/or having symptoms like SOB, chest pain, headache, swelling, blurred vision, dizziness, or head to ED/UC if symptoms with high BP.  Protocols used: Blood Pressure - High-A-AH

## 2024-04-29 ENCOUNTER — Encounter: Payer: Self-pay | Admitting: Family Medicine

## 2024-04-29 ENCOUNTER — Ambulatory Visit (INDEPENDENT_AMBULATORY_CARE_PROVIDER_SITE_OTHER): Admitting: Family Medicine

## 2024-04-29 VITALS — BP 130/84 | HR 84 | Temp 97.4°F | Resp 16 | Ht 62.0 in | Wt 134.5 lb

## 2024-04-29 DIAGNOSIS — Z01818 Encounter for other preprocedural examination: Secondary | ICD-10-CM

## 2024-04-29 DIAGNOSIS — R030 Elevated blood-pressure reading, without diagnosis of hypertension: Secondary | ICD-10-CM | POA: Diagnosis not present

## 2024-04-29 LAB — COMPREHENSIVE METABOLIC PANEL WITH GFR
ALT: 11 U/L (ref 0–35)
AST: 17 U/L (ref 0–37)
Albumin: 4.6 g/dL (ref 3.5–5.2)
Alkaline Phosphatase: 48 U/L (ref 39–117)
BUN: 15 mg/dL (ref 6–23)
CO2: 26 meq/L (ref 19–32)
Calcium: 9.5 mg/dL (ref 8.4–10.5)
Chloride: 105 meq/L (ref 96–112)
Creatinine, Ser: 0.82 mg/dL (ref 0.40–1.20)
GFR: 75.62 mL/min (ref >60.00)
Glucose, Bld: 97 mg/dL (ref 70–99)
Potassium: 4.1 meq/L (ref 3.5–5.1)
Sodium: 140 meq/L (ref 135–145)
Total Bilirubin: 0.6 mg/dL (ref 0.2–1.2)
Total Protein: 7.1 g/dL (ref 6.0–8.3)

## 2024-04-29 LAB — CBC WITH DIFFERENTIAL/PLATELET
Basophils Absolute: 0 K/uL (ref 0.0–0.1)
Basophils Relative: 0.6 % (ref 0.0–3.0)
Eosinophils Absolute: 0.1 K/uL (ref 0.0–0.7)
Eosinophils Relative: 2.2 % (ref 0.0–5.0)
HCT: 42.6 % (ref 36.0–46.0)
Hemoglobin: 14 g/dL (ref 12.0–15.0)
Lymphocytes Relative: 29 % (ref 12.0–46.0)
Lymphs Abs: 1.1 K/uL (ref 0.7–4.0)
MCHC: 32.9 g/dL (ref 30.0–36.0)
MCV: 93.5 fl (ref 78.0–100.0)
Monocytes Absolute: 0.3 K/uL (ref 0.1–1.0)
Monocytes Relative: 8.7 % (ref 3.0–12.0)
Neutro Abs: 2.2 K/uL (ref 1.4–7.7)
Neutrophils Relative %: 59.5 % (ref 43.0–77.0)
Platelets: 287 K/uL (ref 150.0–400.0)
RBC: 4.56 Mil/uL (ref 3.87–5.11)
RDW: 13.3 % (ref 11.5–15.5)
WBC: 3.7 K/uL — ABNORMAL LOW (ref 4.0–10.5)

## 2024-04-29 MED ORDER — AMLODIPINE BESYLATE 5 MG PO TABS: 5.0000 mg | ORAL_TABLET | Freq: Every day | ORAL | 0 refills | Status: DC

## 2024-04-29 NOTE — Progress Notes (Signed)
 Subjective:     Patient ID: Inocente DELENA Alias, female    DOB: [birth year], 63 y.o.   MRN: [id number]  Chief Complaint  Patient presents with   Pre-op Exam    Need surgical clearance for right total knee arthoplasty and blood work done Fasting      HPI Discussed the use of AI scribe software for clinical note transcription with the patient, who gave verbal consent to proceed.  History of Present Illness ARYIAH MONTEROSSO is a 63 year old female who presents for surgical clearance for right knee replacement.  She is scheduled for a right knee replacement surgery on May 10, 2024. Her physical activity has been limited due to a shoulder injury, which she has recently exacerbated. She plans to ride a stationary bike minimally until the surgery. She experiences no major headaches, dizziness, chest pain, heart racing, or shortness of breath during exercise and is able to climb stairs without difficulty.  She has recently started taking amlodipine  for blood pressure management, with home readings between 120-137/70-80 mmHg. She experiences no dizziness or lightheadedness. She has not been on blood pressure medications in the past and notes that her blood pressure tends to be high only during medical visits.  She reports a significant reduction in shoulder nerve pain. No fevers, chills, coughing, vomiting, or diarrhea.    Health Maintenance Due  Topic Date Due   HIV Screening  Never done   Pneumococcal Vaccine: 50+ Years (1 of 1 - PCV) 12/12/2009   MAMMOGRAM  04/10/2024   INFLUENZA VACCINE  04/29/2024    Past Medical History:  Diagnosis Date   Allergy 10/17/1989   Anxiety 2020   Arthritis    Neuromuscular disorder (HCC)    carpal tunnel / Bil   No pertinent past medical history     Past Surgical History:  Procedure Laterality Date   CARPAL TUNNEL RELEASE  11/05/2011   Procedure: CARPAL TUNNEL RELEASE;  Surgeon: Norleen LITTIE Gavel, MD;  Location: [facility];   Service: Orthopedics;  Laterality: Right;   DILATION AND CURETTAGE OF UTERUS     JOINT REPLACEMENT  03/03/2016   TONSILLECTOMY     TOTAL KNEE ARTHROPLASTY Left 03/03/2016   Procedure: LEFT TOTAL KNEE ARTHROPLASTY;  Surgeon: Dempsey Moan, MD;  Location: [facility];  Service: Orthopedics;  Laterality: Left;     Current Outpatient Medications:    [employer] SUNSCREEN SPF 30 EX, apply topically to face and body daily for 30, Disp: , Rfl:    EPINEPHrine  0.3 mg/0.3 mL IJ SOAJ injection, Inject 0.3 mg into the muscle as needed for anaphylaxis., Disp: 1 each, Rfl: 1   ibuprofen (ADVIL,MOTRIN) 200 MG tablet, Take 200 mg by mouth every 8 (eight) hours as needed., Disp: , Rfl:    amLODipine  (NORVASC ) 5 MG tablet, Take 1 tablet (5 mg total) by mouth daily., Disp: 90 tablet, Rfl: 0  Allergies  Allergen Reactions   Bee Pollen Anaphylaxis   Bee Venom Anaphylaxis   Codeine Anaphylaxis and Other (See Comments)   Hydrocodone Itching   Oxycodone Itching   Poison Ivy Extract Other (See Comments)    Acute reaction  poison ivy extract   Other Other (See Comments)   Rabbit Epithelium Other (See Comments)   Rabbit Epithelium Allergy Skin Test Other (See Comments)   ROS neg/noncontributory except as noted HPI/below      Objective:     BP 130/84   Pulse 84   Temp (!) 97.4 F (36.3 C) (Temporal)  Resp 16   Ht 5' 2 (1.575 m)   Wt 134 lb 8 oz (61 kg)   SpO2 98%   BMI 24.60 kg/m  Wt Readings from Last 3 Encounters:  04/29/24 134 lb 8 oz (61 kg)  04/13/24 135 lb 8 oz (61.5 kg)  09/17/23 135 lb (61.2 kg)    Physical Exam   Gen: WDWN NAD HEENT: NCAT, conjunctiva not injected, sclera nonicteric NECK:  supple, no thyromegaly, no nodes, no carotid bruits CARDIAC: RRR, S1S2+, no murmur. DP 2+B LUNGS: CTAB. No wheezes ABDOMEN:  BS+, soft, NTND, No HSM, no masses EXT:  no edema MSK: no gross abnormalities.  NEURO: A&O x3.  CN II-XII intact.  PSYCH: normal mood. Good eye contact     Assessment &  Plan:  Pre-operative clearance -     CBC with Differential/Platelet -     Comprehensive metabolic panel with GFR  Elevated blood pressure reading  Other orders -     amLODIPine  Besylate; Take 1 tablet (5 mg total) by mouth daily.  Dispense: 90 tablet; Refill: 0  Assessment and Plan Assessment & Plan Preoperative evaluation for right knee replacement in patient with right knee osteoarthritis   She is scheduled for right knee replacement surgery on May 10, 2024, due to osteoarthritis. She engages in moderate exercise but is advised to limit activity due to a shoulder injury. She can climb stairs without difficulty and denies chest pain, shortness of breath, or other concerning symptoms. Order preoperative blood work and proceed with surgical clearance for the knee replacement. Ok for surgery-low risk  Essential hypertension   Her hypertension is well-controlled with amlodipine , with home blood pressure readings between 120-137/70-80 mmHg. She denies dizziness or other side effects. Hypertension appears situational, primarily elevated during medical visits, possibly influenced by shoulder pain. Continue amlodipine  and monitor blood pressure at home, especially on days without medication. Consider reducing amlodipine  to every other day post-surgery if blood pressure remains stable. Reassess blood pressure management in September at annual physical.  Neuropathic right shoulder pain   There is a significant reduction in nerve pain in her right shoulder, which may have contributed to improved blood pressure control.    Return for as sch in Sept.  Jenkins CHRISTELLA Carrel, MD

## 2024-04-29 NOTE — Patient Instructions (Signed)
It was very nice to see you today!  Good luck w/surgery   PLEASE NOTE:  If you had any lab tests please let us know if you have not heard back within a few days. You may see your results on MyChart before we have a chance to review them but we will give you a call once they are reviewed by us. If we ordered any referrals today, please let us know if you have not heard from their office within the next week.   Please try these tips to maintain a healthy lifestyle:  Eat most of your calories during the day when you are active. Eliminate processed foods including packaged sweets (pies, cakes, cookies), reduce intake of potatoes, white bread, white pasta, and white rice. Look for whole grain options, oat flour or almond flour.  Each meal should contain half fruits/vegetables, one quarter protein, and one quarter carbs (no bigger than a computer mouse).  Cut down on sweet beverages. This includes juice, soda, and sweet tea. Also watch fruit intake, though this is a healthier sweet option, it still contains natural sugar! Limit to 3 servings daily.  Drink at least 1 glass of water with each meal and aim for at least 8 glasses per day  Exercise at least 150 minutes every week.   

## 2024-04-30 ENCOUNTER — Ambulatory Visit: Payer: Self-pay | Admitting: Family Medicine

## 2024-04-30 NOTE — Progress Notes (Signed)
 Labs are fine but wbc remain on low side.  Not a big concern.  Take B12 and will repeat in Sept.  Good luck with surgery and recovery

## 2024-05-10 DIAGNOSIS — M1711 Unilateral primary osteoarthritis, right knee: Secondary | ICD-10-CM | POA: Diagnosis not present

## 2024-05-10 DIAGNOSIS — M25751 Osteophyte, right hip: Secondary | ICD-10-CM | POA: Diagnosis not present

## 2024-05-10 HISTORY — PX: TOTAL KNEE ARTHROPLASTY: SHX125

## 2024-06-06 ENCOUNTER — Encounter: Payer: Self-pay | Admitting: Family Medicine

## 2024-06-06 ENCOUNTER — Ambulatory Visit (INDEPENDENT_AMBULATORY_CARE_PROVIDER_SITE_OTHER): Payer: BC Managed Care – PPO | Admitting: Family Medicine

## 2024-06-06 VITALS — BP 112/72 | HR 80 | Temp 98.2°F | Ht 62.0 in | Wt 136.6 lb

## 2024-06-06 DIAGNOSIS — I10 Essential (primary) hypertension: Secondary | ICD-10-CM | POA: Insufficient documentation

## 2024-06-06 DIAGNOSIS — Z0000 Encounter for general adult medical examination without abnormal findings: Secondary | ICD-10-CM

## 2024-06-06 MED ORDER — AMLODIPINE BESYLATE 5 MG PO TABS: 5.0000 mg | ORAL_TABLET | Freq: Every day | ORAL | 0 refills | Status: AC
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
## 2024-06-06 NOTE — Patient Instructions (Signed)

## 2024-07-05 DIAGNOSIS — Z1231 Encounter for screening mammogram for malignant neoplasm of breast: Secondary | ICD-10-CM | POA: Diagnosis not present

## 2024-07-05 DIAGNOSIS — Z6825 Body mass index (BMI) 25.0-25.9, adult: Secondary | ICD-10-CM | POA: Diagnosis not present

## 2024-07-05 DIAGNOSIS — Z124 Encounter for screening for malignant neoplasm of cervix: Secondary | ICD-10-CM | POA: Diagnosis not present

## 2024-07-05 DIAGNOSIS — Z01419 Encounter for gynecological examination (general) (routine) without abnormal findings: Secondary | ICD-10-CM | POA: Diagnosis not present

## 2024-09-05 DIAGNOSIS — M25661 Stiffness of right knee, not elsewhere classified: Secondary | ICD-10-CM | POA: Diagnosis not present

## 2024-09-05 DIAGNOSIS — Z5189 Encounter for other specified aftercare: Secondary | ICD-10-CM | POA: Diagnosis not present

## 2024-09-07 DIAGNOSIS — D485 Neoplasm of uncertain behavior of skin: Secondary | ICD-10-CM | POA: Diagnosis not present

## 2024-09-07 DIAGNOSIS — D225 Melanocytic nevi of trunk: Secondary | ICD-10-CM | POA: Diagnosis not present

## 2024-09-07 DIAGNOSIS — L821 Other seborrheic keratosis: Secondary | ICD-10-CM | POA: Diagnosis not present

## 2024-09-07 DIAGNOSIS — L578 Other skin changes due to chronic exposure to nonionizing radiation: Secondary | ICD-10-CM | POA: Diagnosis not present

## 2024-09-07 DIAGNOSIS — L814 Other melanin hyperpigmentation: Secondary | ICD-10-CM | POA: Diagnosis not present

## 2025-06-07 ENCOUNTER — Encounter: Admitting: Family Medicine
